# Patient Record
Sex: Female | Born: 1975 | Race: White | Hispanic: No | Marital: Married | Smoking: Never smoker
Health system: Southern US, Community
[De-identification: ages and names within clinical notes are randomized; demographics above are authoritative.]

## PROBLEM LIST (undated history)

## (undated) DIAGNOSIS — I10 Essential (primary) hypertension: Secondary | ICD-10-CM

## (undated) DIAGNOSIS — F329 Major depressive disorder, single episode, unspecified: Secondary | ICD-10-CM

## (undated) DIAGNOSIS — F32A Depression, unspecified: Secondary | ICD-10-CM

## (undated) DIAGNOSIS — Z8489 Family history of other specified conditions: Secondary | ICD-10-CM

## (undated) DIAGNOSIS — R519 Headache, unspecified: Secondary | ICD-10-CM

## (undated) DIAGNOSIS — R7303 Prediabetes: Secondary | ICD-10-CM

## (undated) DIAGNOSIS — T7840XA Allergy, unspecified, initial encounter: Secondary | ICD-10-CM

## (undated) DIAGNOSIS — F419 Anxiety disorder, unspecified: Secondary | ICD-10-CM

## (undated) DIAGNOSIS — E7800 Pure hypercholesterolemia, unspecified: Secondary | ICD-10-CM

## (undated) DIAGNOSIS — E039 Hypothyroidism, unspecified: Secondary | ICD-10-CM

## (undated) DIAGNOSIS — Z87442 Personal history of urinary calculi: Secondary | ICD-10-CM

## (undated) DIAGNOSIS — R51 Headache: Secondary | ICD-10-CM

## (undated) DIAGNOSIS — N3941 Urge incontinence: Secondary | ICD-10-CM

## (undated) DIAGNOSIS — Z8614 Personal history of Methicillin resistant Staphylococcus aureus infection: Secondary | ICD-10-CM

## (undated) HISTORY — PX: CHOLECYSTECTOMY: SHX55

## (undated) HISTORY — DX: Major depressive disorder, single episode, unspecified: F32.9

## (undated) HISTORY — DX: Anxiety disorder, unspecified: F41.9

## (undated) HISTORY — DX: Depression, unspecified: F32.A

## (undated) HISTORY — DX: Essential (primary) hypertension: I10

## (undated) HISTORY — PX: OTHER SURGICAL HISTORY: SHX169

## (undated) HISTORY — DX: Allergy, unspecified, initial encounter: T78.40XA

---

## 2002-01-03 ENCOUNTER — Other Ambulatory Visit: Admission: RE | Admit: 2002-01-03 | Discharge: 2002-01-03 | Payer: Self-pay | Admitting: Internal Medicine

## 2004-08-30 ENCOUNTER — Ambulatory Visit: Payer: Self-pay | Admitting: Family Medicine

## 2004-09-01 ENCOUNTER — Ambulatory Visit: Payer: Self-pay | Admitting: Family Medicine

## 2005-04-04 ENCOUNTER — Ambulatory Visit: Payer: Self-pay | Admitting: Family Medicine

## 2005-09-29 ENCOUNTER — Ambulatory Visit: Payer: Self-pay | Admitting: Family Medicine

## 2005-10-05 ENCOUNTER — Ambulatory Visit: Payer: Self-pay | Admitting: Family Medicine

## 2005-11-29 ENCOUNTER — Ambulatory Visit: Payer: Self-pay | Admitting: Family Medicine

## 2005-12-15 ENCOUNTER — Ambulatory Visit: Payer: Self-pay | Admitting: Family Medicine

## 2006-09-13 ENCOUNTER — Ambulatory Visit: Payer: Self-pay | Admitting: Family Medicine

## 2007-07-10 ENCOUNTER — Ambulatory Visit: Payer: Self-pay | Admitting: Internal Medicine

## 2007-07-15 ENCOUNTER — Telehealth: Payer: Self-pay | Admitting: Family Medicine

## 2007-10-11 ENCOUNTER — Ambulatory Visit: Payer: Self-pay | Admitting: Family Medicine

## 2007-10-11 ENCOUNTER — Telehealth: Payer: Self-pay | Admitting: Family Medicine

## 2007-10-12 ENCOUNTER — Encounter: Payer: Self-pay | Admitting: Family Medicine

## 2007-10-22 ENCOUNTER — Ambulatory Visit: Payer: Self-pay | Admitting: Family Medicine

## 2007-10-22 DIAGNOSIS — Z87442 Personal history of urinary calculi: Secondary | ICD-10-CM

## 2007-10-22 DIAGNOSIS — F329 Major depressive disorder, single episode, unspecified: Secondary | ICD-10-CM | POA: Insufficient documentation

## 2007-10-22 LAB — CONVERTED CEMR LAB: Vitamin B-12: 146 pg/mL — ABNORMAL LOW (ref 211–911)

## 2007-10-25 ENCOUNTER — Encounter: Payer: Self-pay | Admitting: Family Medicine

## 2007-10-29 LAB — CONVERTED CEMR LAB
Basophils Absolute: 0 10*3/uL (ref 0.0–0.1)
Eosinophils Absolute: 0.1 10*3/uL (ref 0.0–0.7)
Eosinophils Relative: 1 % (ref 0–5)
HCT: 40.8 % (ref 36.0–46.0)
MCV: 86.6 fL (ref 78.0–100.0)
Platelets: 283 10*3/uL (ref 150–400)
RDW: 11.9 % (ref 11.5–15.5)

## 2007-10-31 DIAGNOSIS — Z8614 Personal history of Methicillin resistant Staphylococcus aureus infection: Secondary | ICD-10-CM

## 2007-10-31 HISTORY — DX: Personal history of Methicillin resistant Staphylococcus aureus infection: Z86.14

## 2008-03-03 ENCOUNTER — Emergency Department (HOSPITAL_COMMUNITY): Admission: EM | Admit: 2008-03-03 | Discharge: 2008-03-03 | Payer: Self-pay | Admitting: Emergency Medicine

## 2008-03-06 ENCOUNTER — Encounter: Payer: Self-pay | Admitting: Gastroenterology

## 2008-03-06 ENCOUNTER — Ambulatory Visit: Payer: Self-pay | Admitting: Gastroenterology

## 2008-03-06 ENCOUNTER — Encounter: Admission: RE | Admit: 2008-03-06 | Discharge: 2008-03-06 | Payer: Self-pay | Admitting: Gastroenterology

## 2008-03-06 ENCOUNTER — Telehealth (INDEPENDENT_AMBULATORY_CARE_PROVIDER_SITE_OTHER): Payer: Self-pay | Admitting: *Deleted

## 2008-03-06 DIAGNOSIS — K219 Gastro-esophageal reflux disease without esophagitis: Secondary | ICD-10-CM

## 2008-03-06 DIAGNOSIS — I10 Essential (primary) hypertension: Secondary | ICD-10-CM | POA: Insufficient documentation

## 2008-03-06 DIAGNOSIS — Z9189 Other specified personal risk factors, not elsewhere classified: Secondary | ICD-10-CM | POA: Insufficient documentation

## 2008-03-06 LAB — CONVERTED CEMR LAB
Albumin: 3.7 g/dL (ref 3.5–5.2)
Alkaline Phosphatase: 89 units/L (ref 39–117)
BUN: 10 mg/dL (ref 6–23)
Calcium: 9.2 mg/dL (ref 8.4–10.5)
Eosinophils Absolute: 0.1 10*3/uL (ref 0.0–0.7)
Eosinophils Relative: 1.3 % (ref 0.0–5.0)
GFR calc Af Amer: 106 mL/min
GFR calc non Af Amer: 87 mL/min
HCT: 39.2 % (ref 36.0–46.0)
MCV: 86.9 fL (ref 78.0–100.0)
Monocytes Absolute: 0.6 10*3/uL (ref 0.1–1.0)
Neutro Abs: 5.1 10*3/uL (ref 1.4–7.7)
Platelets: 285 10*3/uL (ref 150–400)
Potassium: 4 meq/L (ref 3.5–5.1)
RDW: 11.4 % — ABNORMAL LOW (ref 11.5–14.6)
WBC: 7.6 10*3/uL (ref 4.5–10.5)

## 2008-03-27 ENCOUNTER — Ambulatory Visit (HOSPITAL_COMMUNITY): Admission: EM | Admit: 2008-03-27 | Discharge: 2008-03-29 | Payer: Self-pay | Admitting: Emergency Medicine

## 2008-03-27 ENCOUNTER — Telehealth: Payer: Self-pay | Admitting: Gastroenterology

## 2008-03-28 ENCOUNTER — Encounter (INDEPENDENT_AMBULATORY_CARE_PROVIDER_SITE_OTHER): Payer: Self-pay | Admitting: Surgery

## 2008-07-03 ENCOUNTER — Ambulatory Visit: Payer: Self-pay | Admitting: Family Medicine

## 2008-09-01 ENCOUNTER — Ambulatory Visit: Payer: Self-pay | Admitting: Family Medicine

## 2008-09-22 ENCOUNTER — Ambulatory Visit: Payer: Self-pay | Admitting: Family Medicine

## 2008-11-03 ENCOUNTER — Encounter: Admission: RE | Admit: 2008-11-03 | Discharge: 2008-11-03 | Payer: Self-pay | Admitting: Obstetrics and Gynecology

## 2009-01-20 ENCOUNTER — Inpatient Hospital Stay (HOSPITAL_COMMUNITY): Admission: AD | Admit: 2009-01-20 | Discharge: 2009-01-23 | Payer: Self-pay | Admitting: Obstetrics and Gynecology

## 2009-01-24 ENCOUNTER — Encounter: Admission: RE | Admit: 2009-01-24 | Discharge: 2009-02-15 | Payer: Self-pay | Admitting: Obstetrics and Gynecology

## 2009-04-15 ENCOUNTER — Ambulatory Visit: Payer: Self-pay | Admitting: Family Medicine

## 2009-04-16 ENCOUNTER — Encounter: Payer: Self-pay | Admitting: Family Medicine

## 2011-02-09 LAB — CBC
HCT: 35.9 % — ABNORMAL LOW (ref 36.0–46.0)
Hemoglobin: 12 g/dL (ref 12.0–15.0)
MCHC: 33.1 g/dL (ref 30.0–36.0)
MCHC: 33.5 g/dL (ref 30.0–36.0)
MCV: 85.8 fL (ref 78.0–100.0)
MCV: 87 fL (ref 78.0–100.0)
Platelets: 243 10*3/uL (ref 150–400)
RBC: 3.7 MIL/uL — ABNORMAL LOW (ref 3.87–5.11)
RBC: 4.2 MIL/uL (ref 3.87–5.11)
RDW: 13.4 % (ref 11.5–15.5)
WBC: 16.7 10*3/uL — ABNORMAL HIGH (ref 4.0–10.5)
WBC: 16.8 10*3/uL — ABNORMAL HIGH (ref 4.0–10.5)

## 2011-02-09 LAB — COMPREHENSIVE METABOLIC PANEL
ALT: 10 U/L (ref 0–35)
AST: 15 U/L (ref 0–37)
Alkaline Phosphatase: 150 U/L — ABNORMAL HIGH (ref 39–117)
CO2: 21 mEq/L (ref 19–32)
Chloride: 103 mEq/L (ref 96–112)
GFR calc Af Amer: >60 mL/min (ref >60)
GFR calc non Af Amer: >60 mL/min (ref >60)
Glucose, Bld: 106 mg/dL — ABNORMAL HIGH (ref 70–99)
Potassium: 3.9 mEq/L (ref 3.5–5.1)
Sodium: 133 mEq/L — ABNORMAL LOW (ref 135–145)

## 2011-02-09 LAB — LACTATE DEHYDROGENASE: LDH: 145 U/L (ref 94–250)

## 2011-03-14 NOTE — Op Note (Signed)
[name], [name]               ACCOUNT NO.:  [id number]   MEDICAL RECORD NO.:  [id number]          PATIENT TYPE:  INP   LOCATION:  5121                         FACILITY:  [facility]   PHYSICIAN:  Maisie Fus A. Cornett, M.D.DATE OF BIRTH:  1975/12/10   DATE OF PROCEDURE:  03/28/2008  DATE OF DISCHARGE:                               OPERATIVE REPORT   PREOPERATIVE DIAGNOSIS:  Symptomatic cholelithiasis.   POSTOPERATIVE DIAGNOSIS:  Early acute cholecystitis.   PROCEDURE:  Laparoscopic cholecystectomy with cholangiogram.   SURGEON:  Thomas A. Cornett, M.D.   ASSISTANT:  Velora Heckler, M.D.   ANESTHESIA:  General endotracheal anesthesia with 0.25% Sensorcaine  local.   ESTIMATED BLOOD LOSS:  20 mL   SPECIMEN:  Gallbladder with gallstones to pathology.   DRAINS:  None.   INDICATIONS FOR PROCEDURE:  The patient is a 35 year old female who was  initially seen by Dr. Bertram Savin for symptomatic cholelithiasis.  She  had an exacerbation of her symptoms last night, came to the emergency  room, and was admitted.  Dr. Freida Busman was unavailable to do the procedure,  and therefore, we discussed doing it here today; since her pain had  worsened.  She agreed to proceed since her pain had become  uncontrollable medically.  She presents today for laparoscopic  cholecystectomy for symptomatic cholelithiasis.   Informed consent was obtained.  Risk of bleeding, infection, duct  injury, and injury to other organs were discussed.  Also, alternative  treatments were discussed which would be nonoperative, but have a low  success rate.   DESCRIPTION OF PROCEDURE:  The patient was brought to the operating  room.  She was placed supine on the operating room table.  After  induction of general anesthesia, abdomen was prepped and draped in a  sterile fashion.  A 1-cm supraumbilical incision was made.  Dissection  was carried down to her fascia.  Her fascia was opened with a scalpel  blade.  Purse-string  suture of 0 Vicryl was placed and I used my finger  to push through the peritoneal lining into the abdominal cavity without  difficulty.  I then placed a 12-mm Hasson cannula without difficulty and  the purse-string suture was secured to the cannula.  Pneumoperitoneum  was created with 15 mmHg of CO2 and the laparoscope was placed.  She was  placed in reverse Trendelenburg and rolled to her left.  Laparoscopy  revealed no other significant abnormality.  The gallbladder was white  and edematous consistent with acute cholecystitis.  An 11-mm subxiphoid  port was placed under direct vision.  Two 5-mm ports were placed in the  right midabdomen under direct vision.  Gallbladder was identified and  grabbed by its dome.  There were adhesions of the duodenum to  gallbladder.  We gently pulled this away without injuring the duodenum  or gallbladder.  The infundibulum was then grabbed and pulled towards  the patient's right lower quadrant.  The cautery was used to score the  peritoneal covering of the gallbladder exposing the infundibulum and  cystic duct.  I was able to dissect around the cystic  duct.  This was  the only tubular structure entering the gallbladder.  I placed a single  clip on the gallbladder side of the cystic duct.  A small incision was  made in the cystic duct and bile returned.  Through a separate stab  wound, a Cook cholangiogram catheter was introduced and placed in the  cystic duct.  Clip was used to hold in place.  Intraoperative  cholangiogram was then performed using one-half strength Hypaque dye.  There was free flow of contrast down the cystic duct into the common  bile duct into the duodenum.  There was free flow up the common hepatic  duct into right and left ductules.  No evidence of stone or  extravasation.  At this point, the catheter was removed, the cystic duct  stump was triple-clipped and divided.  Cystic artery was identified.  There was both a posterior and  anterior branch.  These were controlled  individually on the gallbladder with clips.  Cautery was used to dissect  the gallbladder from the gallbladder fossa without difficulty.  Gallbladder was placed in Endocatch bag and passed off the field.  The  hilum was examined.  I saw no evidence of bleeding or leakage of bile.  Gallbladder bed was dry.  We then put the camera in the subxiphoid port,  extracted the gallbladder through the umbilicus, and passed it off the  field.  We closed this with the purse-string suture of 0 Vicryl and an  additional 0 Vicryl was used.  We suctioned out the irrigation.  All  ports were then removed after allowing CO2 to escape.  There was no  evidence of any port site bleeding under direct examination.  Once the  trocars were passed off the field, we closed the skin incision with 4-0  Monocryl.  Dermabond was applied.  All final counts of sponge, needle,  and instruments were found to be correct at this portion of the case.  The patient was then awoke and taken to recovery in satisfactory  condition.      Thomas A. Cornett, M.D.  Electronically Signed     TAC/MEDQ  D:  03/28/2008  T:  03/28/2008  Job:  [id number]

## 2011-03-14 NOTE — H&P (Signed)
NAMEDANEEN, VOLCY               ACCOUNT NO.:  [id number]   MEDICAL RECORD NO.:  [id number]          PATIENT TYPE:  INP   LOCATION:  5121                         FACILITY:  [facility]   PHYSICIAN:  Ollen Gross. Vernell Morgans, M.D. DATE OF BIRTH:  13-Dec-1975   DATE OF ADMISSION:  03/27/2008  DATE OF DISCHARGE:                              HISTORY & PHYSICAL   HISTORY OF PRESENT ILLNESS:  Ms. Tercero is 35 year old white female who  presents to the emergency department at night with 2-week history of  epigastric pain that radiates into her back.  The pain has been  associated with some nausea.  The patient has had known gallstones and  recently saw Dr. Freida Busman for this who has her setup for a cholecystectomy  on April 14, 2008.  She is concerned about her pain increasing, does not  feel she is going to make it to that day.  She otherwise denies any  fevers or chills.  No shortness of breath.  No diarrhea or dysuria.  Otherwise, review of systems is unremarkable.   PAST MEDICAL HISTORY:  Significant for reflux.   PAST SURGICAL HISTORY:  None.   MEDICATIONS:  Protonix and Percocet.   ALLERGIES:  No known drug allergies.   SOCIAL HISTORY:  She denies tobacco or tobacco products.   FAMILY HISTORY:  Noncontributory.   PHYSICAL EXAMINATION:  VITAL SIGNS:  Her temperature is 97.2, blood  pressure 150/87, and pulse 107.  GENERAL:  She is a well-developed, well-nourished white female, in no  acute distress.  SKIN:  Warm and dry.  No jaundice.  HEENT:  Eyes are anicteric.  Extraocular movements are intact.  Pupils  are equal, round, and reactive to light.  Sclerae nonicteric.  LUNGS:  Clear bilaterally with no use of accessory inspiratory muscles.  HEART:  Regular rate and rhythm with impulse in the left chest.  ABDOMEN:  Soft with moderate epigastric tenderness.  No palpable mass or  hepatosplenomegaly.  EXTREMITIES:  No cyanosis, clubbing, or edema.  Good strength in arms  and legs.  PSYCHOLOGIC:   She is alert and oriented x3 with no evidence of anxiety  or depression.   On review of her lab work, she had normal liver functions, normal  lipase, and normal white count.  Ultrasound showed stones in the  gallbladder.   ASSESSMENT AND PLAN:  This is a 35 year old white female with what  sounds like symptomatic gallstones.  Because of the risk of further  painful episodes of pancreatitis, I do think she would probably benefit  from having her  gallbladder removed.  We may have to do it during this admission, given  the amount of increasing pain she has been having.  We will plan to  admit her a night for pain control and plan for cholecystectomy during  the weekend as the schedule will allow.      Ollen Gross. Vernell Morgans, M.D.  Electronically Signed     PST/MEDQ  D:  03/27/2008  T:  03/28/2008  Job:  [id number]

## 2011-03-14 NOTE — H&P (Signed)
[name], [name]               ACCOUNT NO.:  [id number]   MEDICAL RECORD NO.:  [id number]        PATIENT TYPE:  WINP   LOCATION:                                FACILITY:  [facility]   PHYSICIAN:  Lenoard Aden, M.D.DATE OF BIRTH:  04-16-1976   DATE OF ADMISSION:  01/20/2009  DATE OF DISCHARGE:  01/23/2009                              HISTORY & PHYSICAL   CHIEF COMPLAINT:  Gestational hypertension at 39 weeks, for induction.   HISTORY:  The patient is a 35 year old white female G2, P2 at 74 weeks'  gestation for cervical ripening and induction.  The patient has a  history of stable gestational hypertension and __________ headaches.  The patient's blood pressure in the office is ranging __________.  Blood  pressure has been __________ previously normal __________ 24-hour urine.   ALLERGIES:  She has no known drug allergies.   MEDICATIONS:  Prenatal vitamins.   FAMILY HISTORY:  Family history of heart disease and diabetes.   PREVIOUS OB HISTORY:  Remarkable for 1 vaginal delivery of a 9 pound 12  ounce fetus and a previous history of occult __________.   PRENATAL COURSE:  Complicated by hypertension.   PHYSICAL EXAMINATION:  GENERAL:  She is a well-developed, well-nourished  white female in no acute distress.  VITAL SIGNS:  Blood pressure is 138 to 140s over 90.  HEENT:  Normal.  LUNGS:  Clear.  HEART:  Regular rate and rhythm.  ABDOMEN:  Soft, gravid, and nontender.  Estimated fetal weight 8 pounds.  Cervix is closed, 50% vertex, -1.  EXTREMITIES:  There are no cords.  NEUROLOGIC:  Nonfocal.  SKIN:  Intact.  Cervidil was placed __________ reactive.   IMPRESSION:  A 39-week intrauterine pregnancy with normal interval  growth.  Gestational hypertension with labile hypertension __________.  Most recent ultrasound consistent with reassuring fetal heart rate  surveillance, but notable for AFI that remains stable but decreased,  borderline __________.   PLAN:  We will  proceed with cervical ripening, induction __________.  We  will check labs.      Lenoard Aden, M.D.  Electronically Signed     RJT/MEDQ  D:  01/20/2009  T:  01/21/2009  Job:  [id number]

## 2011-03-17 NOTE — Assessment & Plan Note (Signed)
[facility]                                 ON-CALL NOTE   NAME:STALEYFrancys, Bolin                      MRN:          [id number]  DATE:07/15/2007                            DOB:          [birth year]    Phone call came from her husband.  I don't have the phone number now  because it is just on my phone, not on my pager.  Date of birth is  [birth year].  She is a patient of Dr. Roxy Manns.  She calls  because she was congested and was seen last Wednesday by Dr. Ermalene Searing,  given a Z-Pak.  Dr. Ermalene Searing was concerned about early pneumonia.  She  seemed to improve a little bit but has gotten a little worse since that  time.  Not really too sick, with shortness of breath or anything acute,  but her husband was hoping to get something else for her to nip it in  the bud.   PLAN:  I told him we didn't phone in antibiotics after hours.  Didn't  sound like she needed emergency room care at this time so I asked him to  call again tomorrow morning after 8:30.  He says he did call several  times today but hadn't gotten a call back.     Karie Schwalbe, MD  Electronically Signed    RIL/MedQ  DD: 07/15/2007  DT: 07/16/2007  Job #: [id number]   cc:   Kerby Nora, MD  Audrie Gallus Milinda Antis, MD

## 2011-07-26 LAB — CBC
HCT: 38.8
Hemoglobin: 13.5
MCHC: 34.8
MCV: 86
Platelets: 287
RBC: 4.51
RDW: 12.2
WBC: 8.2

## 2011-07-26 LAB — DIFFERENTIAL
Basophils Absolute: 0
Basophils Relative: 0
Eosinophils Absolute: 0.2
Eosinophils Relative: 2
Lymphocytes Relative: 27
Lymphs Abs: 2.2
Monocytes Absolute: 0.6
Monocytes Relative: 7
Neutro Abs: 5.1
Neutrophils Relative %: 63

## 2011-07-26 LAB — COMPREHENSIVE METABOLIC PANEL
AST: 16
Albumin: 4.1
Alkaline Phosphatase: 80
CO2: 27
Chloride: 106
Creatinine, Ser: 0.88
GFR calc Af Amer: >60
GFR calc non Af Amer: >60
Potassium: 3.9
Total Bilirubin: 0.5

## 2011-07-26 LAB — COMPREHENSIVE METABOLIC PANEL WITH GFR
ALT: 14
BUN: 6
Calcium: 9.5
Glucose, Bld: 109 — ABNORMAL HIGH
Sodium: 139
Total Protein: 7.2

## 2011-07-26 LAB — LIPASE, BLOOD: Lipase: 21

## 2011-12-18 ENCOUNTER — Ambulatory Visit: Payer: Self-pay | Admitting: Family Medicine

## 2012-01-18 ENCOUNTER — Ambulatory Visit: Payer: Self-pay | Admitting: Family Medicine

## 2013-05-21 ENCOUNTER — Ambulatory Visit: Payer: BC Managed Care – PPO

## 2013-05-21 ENCOUNTER — Ambulatory Visit (INDEPENDENT_AMBULATORY_CARE_PROVIDER_SITE_OTHER): Payer: BC Managed Care – PPO | Admitting: Family Medicine

## 2013-05-21 VITALS — BP 164/104 | HR 110 | Temp 98.0°F | Resp 18 | Ht 67.5 in | Wt 258.0 lb

## 2013-05-21 DIAGNOSIS — R079 Chest pain, unspecified: Secondary | ICD-10-CM

## 2013-05-21 DIAGNOSIS — R609 Edema, unspecified: Secondary | ICD-10-CM

## 2013-05-21 DIAGNOSIS — I471 Supraventricular tachycardia: Secondary | ICD-10-CM

## 2013-05-21 DIAGNOSIS — I10 Essential (primary) hypertension: Secondary | ICD-10-CM

## 2013-05-21 DIAGNOSIS — R600 Localized edema: Secondary | ICD-10-CM

## 2013-05-21 LAB — POCT CBC
Granulocyte percent: 70.8 %G (ref 37–80)
HCT, POC: 42.4 % (ref 37.7–47.9)
Hemoglobin: 13.3 g/dL (ref 12.2–16.2)
MCHC: 31.4 g/dL — AB (ref 31.8–35.4)
MPV: 8.4 fL (ref 0–99.8)
POC Granulocyte: 9 — AB (ref 2–6.9)
POC MID %: 5.7 %M (ref 0–12)
RBC: 4.68 M/uL (ref 4.04–5.48)

## 2013-05-21 LAB — POCT URINALYSIS DIPSTICK
Glucose, UA: NEGATIVE
Leukocytes, UA: NEGATIVE
Nitrite, UA: NEGATIVE
Urobilinogen, UA: 0.2

## 2013-05-21 LAB — POCT URINE PREGNANCY: Preg Test, Ur: NEGATIVE

## 2013-05-21 MED ORDER — METOPROLOL SUCCINATE ER 50 MG PO TB24: ORAL_TABLET | ORAL | Status: DC

## 2013-05-21 MED ORDER — HYDROCHLOROTHIAZIDE 12.5 MG PO TABS: 12.5000 mg | ORAL_TABLET | Freq: Every day | ORAL | Status: DC

## 2013-05-21 NOTE — Progress Notes (Signed)
[address]   [place], [place]  [address]   [phone]  Subjective:    Patient ID: [name], female    DOB: 10-21-1976, 37 y.o.   MRN: [id number]  HPI This 37 y.o. female presents for evaluation of tachycardia, chest pain, leg swelling.  Onset of rapid heart rate; lost forty pounds in past year.  Has gained seven pounds in past week.  Started walking with friend; almost passed out; heart 136-140; got a little nervious; stipped walking.  Sold house; lots of stressors.  Grandfather in hospital.  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  Now must pack.  Husband thinks that a lot is stress.  Burning up; feels heart fluttering.  Upon awakening, less than 100 every morning.  Elevates to 114 during the day.  Stays in 90s at lowest during the day.  Bad headache; had a severe headache all night long; no nausea, blurred vision, dizziness, photosensitivies.  Headaches few times per week; taking Advil with relief.  Yesterday was stressful.  Considering building.  Since school let out, jpt was sick; went to walk in clinic with URI.  Then got pink eye; got sick again; slept all week.  Went to walk in clinic; diagnosed with bronchitis; now losing the cough.  Leg swelling; legs hurt from swelling.  Pain in legs at night.  No medications currenlty; stopped all medications since 05/2012.  Emotionally good until stressors.  LMP regular; 05/01/13.  Hands swollen. Can wear rings in morning but hurts in evening.  Had severe chest pain two days ago while driving home from sister's house; left sided chest pain; no associated nausea, SOB, diaphoresis. Thought likely secondary to GERD; had eaten BBQ the night before; no frequent indigestion or heartburn.   Review of Systems  Constitutional: Negative for fever, chills, diaphoresis, fatigue and unexpected weight change.  HENT: Negative for ear pain, congestion, rhinorrhea, sneezing and postnasal drip.   Respiratory: Negative for cough, shortness of  breath, wheezing and stridor.   Cardiovascular: Positive for chest pain, palpitations and leg swelling.  Gastrointestinal: Negative for nausea and vomiting.  Neurological: Positive for dizziness, light-headedness and headaches. Negative for tremors, seizures, syncope, facial asymmetry, speech difficulty, weakness and numbness.    Past Medical History  Diagnosis Date  . Allergy   . Depression   . Chronic kidney disease   . Anxiety     Past Surgical History  Procedure Laterality Date  . Cholecystectomy      Prior to Admission medications   Not on File    No Known Allergies  History   Social History  . Marital Status: Married    Spouse Name: N/A    Number of Children: 2  . Years of Education: N/A   Occupational History  . 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 Smoking status: Never Smoker   . Smokeless tobacco: Not on file  . Alcohol Use: No  . Drug Use: No  . Sexually Active: Yes   Other Topics Concern  . Not on file   Social History Narrative   Marital status: married      Children: two (son 33 yo and daughter 20 yo)      Lives: with husband, two children.      Employment: Runner, broadcasting/film/video at [employer]      Tobacco: none      Alcohol: none  Drugs: none      Exercise: walking    Family History  Problem Relation Age of Onset  . Diabetes Mother   . Heart disease Mother     CAD with stenting  . Hypertension Mother   . Hyperlipidemia Mother   . Hypothyroidism Mother   . Heart disease Father   . Heart disease Maternal Grandmother   . Stroke Maternal Grandmother   . Heart disease Paternal Grandmother   . Heart disease Paternal Grandfather        Objective:   Physical Exam  Nursing note and vitals reviewed. Constitutional: She is oriented to person, place, and time. She appears well-developed and well-nourished. No distress.  HENT:  Head: Normocephalic and atraumatic.  Right Ear:  External ear normal.  Left Ear: External ear normal.  Nose: Nose normal.  Mouth/Throat: Oropharynx is clear and moist.  Eyes: Conjunctivae and EOM are normal. Pupils are equal, round, and reactive to light.  Neck: Normal range of motion. Neck supple. No JVD present. No thyromegaly present.  Cardiovascular: Normal heart sounds.  Tachycardia present.  Exam reveals no gallop and no friction rub.   No murmur heard. B non-pitting edema BLE up to upper calves B; Hommen's negative.  Pulmonary/Chest: Effort normal and breath sounds normal. She has no wheezes. She has no rales.  Abdominal: Soft. Bowel sounds are normal. She exhibits no distension. There is no tenderness. There is no rebound and no guarding.  Lymphadenopathy:    She has no cervical adenopathy.  Neurological: She is alert and oriented to person, place, and time. No cranial nerve deficit. She exhibits normal muscle tone. Coordination normal.  Skin: Skin is warm and dry. No rash noted. She is not diaphoretic.  Psychiatric: She has a normal mood and affect. Her behavior is normal. Judgment and thought content normal.    EKG: sinus tachycardia at 104; no ST changes.  Results for orders placed in visit on 05/21/13  POCT CBC      Result Value Range   WBC 12.7 (*) 4.6 - 10.2 K/uL   Lymph, poc 3.0  0.6 - 3.4   POC LYMPH PERCENT 23.5  10 - 50 %L   MID (cbc) 0.7  0 - 0.9   POC MID % 5.7  0 - 12 %M   POC Granulocyte 9.0 (*) 2 - 6.9   Granulocyte percent 70.8  37 - 80 %G   RBC 4.68  4.04 - 5.48 M/uL   Hemoglobin 13.3  12.2 - 16.2 g/dL   HCT, POC 40.9  81.1 - 47.9 %   MCV 90.5  80 - 97 fL   MCH, POC 28.4  27 - 31.2 pg   MCHC 31.4 (*) 31.8 - 35.4 g/dL   RDW, POC 91.4     Platelet Count, POC 294  142 - 424 K/uL   MPV 8.4  0 - 99.8 fL  POCT URINE PREGNANCY      Result Value Range   Preg Test, Ur Negative    POCT URINALYSIS DIPSTICK      Result Value Range   Color, UA yellow     Clarity, UA cloudy     Glucose, UA neg      Bilirubin, UA neg     Ketones, UA neg     Spec Grav, UA 1.025     Blood, UA trace-intact     pH, UA 6.0     Protein, UA neg     Urobilinogen, UA 0.2  Nitrite, UA neg     Leukocytes, UA Negative     [facility] reading (PRIMARY) by  Dr. Katrinka Blazing. CXR:  NAD.      Assessment & Plan:  Chest pain - Plan: POCT CBC, POCT urine pregnancy, POCT urinalysis dipstick, TSH, Comprehensive metabolic panel, DG Chest 2 View  Edema of both legs - Plan: POCT CBC, POCT urine pregnancy, POCT urinalysis dipstick, TSH, Comprehensive metabolic panel, DG Chest 2 View  Paroxysmal supraventricular tachycardia - Plan: POCT CBC, POCT urine pregnancy, POCT urinalysis dipstick, TSH, Comprehensive metabolic panel, DG Chest 2 View  Essential hypertension, benign - Plan: hydrochlorothiazide (HYDRODIURIL) 12.5 MG tablet, metoprolol succinate (TOPROL-XL) 50 MG 24 hr tablet   1.  Chest pain;  New. Atypical; non-exertional.  EKG normal other than tachycardia at 104.  If persists, will warrant referral to cardiology for stress testing.  To ED for recurrence; patient agreeable. Patient declined referral to cardiology at this time; would like to try medication first. 2.  Tachycardia:  New.  Ranging from 100-135; EKG with sinus tach at 104.  Obtain labs; rx for Metoprolol ER 50mg  1/2 daily.  Pt declined referral to cardiology at this time; prefers medication trial. 3.  HTN:  New onset.  Multiple walk in clinic visits with elevated pulse and BP.  Rx for HCTZ 12.5mg  once daily; Rx for Metoprolol ER 50mg  1/2 qhs.  Obtain labs.  F/u 3 weeks for reevaluation. 4.  Edema BLE:  New onset.  Obtain labs. Rx for HCTZ provided.  Meds ordered this encounter  Medications  . hydrochlorothiazide (HYDRODIURIL) 12.5 MG tablet    Sig: Take 1 tablet (12.5 mg total) by mouth daily.    Dispense:  30 tablet    Refill:  5  . metoprolol succinate (TOPROL-XL) 50 MG 24 hr tablet    Sig: 1/2 tablet daily.  Take with or immediately following a meal.     Dispense:  30 tablet    Refill:  5

## 2013-05-22 ENCOUNTER — Telehealth: Payer: Self-pay

## 2013-05-22 NOTE — Progress Notes (Signed)
Appt made with pt for 8/13 at 4pm

## 2013-05-22 NOTE — Telephone Encounter (Signed)
Pt is having headaches and is not sure if it is the medication she is taking for blood pressure of something else   Please call [phone]

## 2013-05-23 ENCOUNTER — Encounter: Payer: Self-pay | Admitting: Family Medicine

## 2013-05-23 LAB — COMPREHENSIVE METABOLIC PANEL
AST: 17 U/L (ref 0–37)
Alkaline Phosphatase: 95 U/L (ref 39–117)
BUN: 8 mg/dL (ref 6–23)
Glucose, Bld: 88 mg/dL (ref 70–99)
Sodium: 137 mEq/L (ref 135–145)
Total Bilirubin: 0.4 mg/dL (ref 0.3–1.2)
Total Protein: 7 g/dL (ref 6.0–8.3)

## 2013-05-23 MED ORDER — SPIRONOLACTONE 25 MG PO TABS: 25.0000 mg | ORAL_TABLET | Freq: Every day | ORAL | Status: DC

## 2013-05-23 NOTE — Telephone Encounter (Signed)
Called her to advise.  

## 2013-05-23 NOTE — Telephone Encounter (Signed)
I have sent in Spironolactone to pharmacy which is a different diuretic.

## 2013-05-23 NOTE — Telephone Encounter (Signed)
Patient states she has headache and feels bad on the HCTZ she has d/c and she feels better today. She wants an alternative to this medication. Please advise. She will go to [facility] and check her BP and let me know what it is.

## 2013-05-28 ENCOUNTER — Encounter: Payer: Self-pay | Admitting: Family Medicine

## 2013-06-11 ENCOUNTER — Encounter: Payer: Self-pay | Admitting: Family Medicine

## 2013-06-11 ENCOUNTER — Ambulatory Visit (INDEPENDENT_AMBULATORY_CARE_PROVIDER_SITE_OTHER): Payer: BC Managed Care – PPO | Admitting: Family Medicine

## 2013-06-11 VITALS — BP 136/97 | HR 109 | Temp 98.3°F | Resp 18 | Ht 66.75 in | Wt 259.0 lb

## 2013-06-11 DIAGNOSIS — J309 Allergic rhinitis, unspecified: Secondary | ICD-10-CM

## 2013-06-11 DIAGNOSIS — I10 Essential (primary) hypertension: Secondary | ICD-10-CM

## 2013-06-11 DIAGNOSIS — R000 Tachycardia, unspecified: Secondary | ICD-10-CM

## 2013-06-11 DIAGNOSIS — M545 Low back pain: Secondary | ICD-10-CM

## 2013-06-11 DIAGNOSIS — F411 Generalized anxiety disorder: Secondary | ICD-10-CM

## 2013-06-11 LAB — BASIC METABOLIC PANEL
Calcium: 9.5 mg/dL (ref 8.4–10.5)
Glucose, Bld: 104 mg/dL — ABNORMAL HIGH (ref 70–99)
Potassium: 4.1 mEq/L (ref 3.5–5.3)
Sodium: 136 mEq/L (ref 135–145)

## 2013-06-11 MED ORDER — METOPROLOL SUCCINATE ER 50 MG PO TB24: ORAL_TABLET | ORAL | Status: DC

## 2013-06-11 MED ORDER — SERTRALINE HCL 50 MG PO TABS: 50.0000 mg | ORAL_TABLET | Freq: Every day | ORAL | Status: DC

## 2013-06-11 MED ORDER — CYCLOBENZAPRINE HCL 5 MG PO TABS: 5.0000 mg | ORAL_TABLET | Freq: Every evening | ORAL | Status: DC | PRN

## 2013-06-11 NOTE — Progress Notes (Signed)
[address]   [place], [place]  [address]   [phone]  Subjective:    Patient ID: [name], female    DOB: 1976/06/11, 37 y.o.   MRN: [id number]  HPI This 37 y.o. female presents for three week follow-up:  1.  Tachycardia:  Pulse running 90s most of the time; less tachycardia since starting Metoprolol.    2.  Hypertension:  At home, BP running 148-150/85-90.  Taking Metoprolol ER 50mg  1/2 at 4:00pm.  Suffered with severe nausea, headache; really got dizzy while taking HCTZ.  Stopped taking it; no problems with Spironolactone.  3.  Anxiety: requesting Zoloft.  Husband says that patient is mean.  Currently about to move and stress is really getting to patient.  Irritable; short tempered.    4.  Low back pain:  Husband stood on back and caused worsening back pain.  R sided; +radiation into leg.  No n/t.  No weakness; no b/b dysfunction; no saddle paresthesias.  Taken nothing.    5.  Leg swelling:  Much better; stopped after first two weeks.  Today feels swollen but time for menses.    Review of Systems  Constitutional: Negative for fever, chills, diaphoresis and fatigue.  Respiratory: Negative for shortness of breath, wheezing and stridor.   Cardiovascular: Negative for chest pain, palpitations and leg swelling.  Gastrointestinal: Negative for nausea and vomiting.  Musculoskeletal: Positive for myalgias, back pain and gait problem. Negative for joint swelling and arthralgias.  Neurological: Negative for dizziness, tremors, seizures, syncope, facial asymmetry, speech difficulty, weakness, light-headedness, numbness and headaches.  Psychiatric/Behavioral: Negative for suicidal ideas, sleep disturbance, self-injury and dysphoric mood. The patient is nervous/anxious.        Objective:   Physical Exam  Nursing note and vitals reviewed. Constitutional: She is oriented to person, place, and time. She appears well-developed and well-nourished. No distress.  HENT:  Head:  Normocephalic and atraumatic.  Eyes: Conjunctivae and EOM are normal. Pupils are equal, round, and reactive to light.  Neck: Normal range of motion. Neck supple. No thyromegaly present.  Cardiovascular: Normal rate, regular rhythm, normal heart sounds and intact distal pulses.  Exam reveals no gallop and no friction rub.   No murmur heard. Pulmonary/Chest: Effort normal and breath sounds normal. She has no wheezes. She has no rales.  Musculoskeletal:       Lumbar back: She exhibits decreased range of motion, tenderness, pain and spasm. She exhibits no bony tenderness.  Lumbar spine:  +TTP midline and paraspinal region; straight leg raises equivocal; motor 5/5 BLE.  Toe and heel walking intact; marching intact.  Lymphadenopathy:    She has no cervical adenopathy.  Neurological: She is alert and oriented to person, place, and time. She has normal strength. No sensory deficit.  Skin: Skin is warm and dry. No rash noted. She is not diaphoretic.  Psychiatric: She has a normal mood and affect. Her behavior is normal. Judgment and thought content normal.       Assessment & Plan:  Essential hypertension, benign - Plan: metoprolol succinate (TOPROL-XL) 50 MG 24 hr tablet, Basic metabolic panel  Generalized anxiety disorder - Plan: sertraline (ZOLOFT) 50 MG tablet  Tachycardia - Plan: Basic metabolic panel  Allergic rhinitis  Low back pain   1.  HTN: improved; increase Metoprolol ER to 50mg  one tablet daily.  Continue Spironolactone.  Obtain labs. 2. Tachycardia; improving; will increase Metoprolol to 50mg  one tablet daily. 3.  LE swelling: improved with Spironolactone. 4.  Anxiety:  New. Rx for Zoloft provided. 5.  Low back pain:  New.  Recommend rest, heat, frequent ambulation; rx for Flexeril provided. 6. Allergic Rhinitis: worsening; recommend Claritin or Zyrtec or Allegra daily.    Meds ordered this encounter  Medications  . metoprolol succinate (TOPROL-XL) 50 MG 24 hr tablet     Sig: 1 tablet daily.  Take with or immediately following a meal.    Dispense:  30 tablet    Refill:  5  . sertraline (ZOLOFT) 50 MG tablet    Sig: Take 1 tablet (50 mg total) by mouth daily.    Dispense:  30 tablet    Refill:  3  . cyclobenzaprine (FLEXERIL) 5 MG tablet    Sig: Take 1 tablet (5 mg total) by mouth at bedtime as needed for muscle spasms.    Dispense:  30 tablet    Refill:  1

## 2013-06-16 ENCOUNTER — Encounter: Payer: Self-pay | Admitting: Family Medicine

## 2013-08-20 ENCOUNTER — Encounter: Payer: Self-pay | Admitting: Family Medicine

## 2013-08-20 ENCOUNTER — Ambulatory Visit (INDEPENDENT_AMBULATORY_CARE_PROVIDER_SITE_OTHER): Payer: BC Managed Care – PPO | Admitting: Family Medicine

## 2013-08-20 VITALS — BP 140/82 | HR 90 | Temp 97.0°F | Resp 18 | Ht 68.0 in | Wt 260.2 lb

## 2013-08-20 DIAGNOSIS — I10 Essential (primary) hypertension: Secondary | ICD-10-CM

## 2013-08-20 DIAGNOSIS — F419 Anxiety disorder, unspecified: Secondary | ICD-10-CM

## 2013-08-20 DIAGNOSIS — R000 Tachycardia, unspecified: Secondary | ICD-10-CM

## 2013-08-20 DIAGNOSIS — E039 Hypothyroidism, unspecified: Secondary | ICD-10-CM

## 2013-08-20 DIAGNOSIS — F411 Generalized anxiety disorder: Secondary | ICD-10-CM

## 2013-08-20 LAB — CBC WITH DIFFERENTIAL/PLATELET
Basophils Absolute: 0 10*3/uL (ref 0.0–0.1)
Eosinophils Relative: 1 % (ref 0–5)
HCT: 37.8 % (ref 36.0–46.0)
Hemoglobin: 13.1 g/dL (ref 12.0–15.0)
Lymphocytes Relative: 29 % (ref 12–46)
Lymphs Abs: 3.1 10*3/uL (ref 0.7–4.0)
MCV: 82 fL (ref 78.0–100.0)
Monocytes Absolute: 0.6 10*3/uL (ref 0.1–1.0)
Monocytes Relative: 6 % (ref 3–12)
Neutro Abs: 6.6 10*3/uL (ref 1.7–7.7)
RBC: 4.61 MIL/uL (ref 3.87–5.11)
WBC: 10.5 10*3/uL (ref 4.0–10.5)

## 2013-08-20 MED ORDER — METOPROLOL SUCCINATE ER 50 MG PO TB24: ORAL_TABLET | ORAL | Status: DC

## 2013-08-20 MED ORDER — SPIRONOLACTONE 25 MG PO TABS: 25.0000 mg | ORAL_TABLET | Freq: Every day | ORAL | Status: DC

## 2013-08-20 MED ORDER — SERTRALINE HCL 50 MG PO TABS: 50.0000 mg | ORAL_TABLET | Freq: Every day | ORAL | Status: DC

## 2013-08-20 NOTE — Progress Notes (Signed)
[address]   [place], [place]  [address]   [phone]  Subjective:    Patient ID: [name], female    DOB: [birth year], 37 y.o.   MRN: [id number]  HPI This 37 y.o. female presents for two month evaluation of the following:  1.  HTN:  Two month follow-up; at last visit increased Metoprolol to 50mg  daily.    2. Tachycardia:  Pulse is 70-80s; with walking to 90s. Metoprolol increased to 50mg  at last visit.   3.  Anxiety:  Has been living with in-laws.  Closing on Monday.  Keeping calm with Zoloft.  At last visit, Zoloft started.  4. Leg swelling:  No further leg swelling.   5.  Hypothyroidism:  OB/GYN placed on Levothyroxine daily; due for six week labs.    6.  Lower back pain, menometrorrhagia:  Started menses right after last visit; when menses completed, lower back pain resolved.  Review of Systems  Constitutional: Negative for fever, chills, diaphoresis and fatigue.  Respiratory: Negative for shortness of breath.   Cardiovascular: Negative for chest pain, palpitations and leg swelling.  Endocrine: Negative for cold intolerance, heat intolerance, polydipsia, polyphagia and polyuria.  Musculoskeletal: Negative for back pain and myalgias.  Neurological: Negative for dizziness, syncope, weakness, light-headedness and headaches.  Psychiatric/Behavioral: Negative for dysphoric mood and decreased concentration. The patient is not nervous/anxious.    Past Medical History  Diagnosis Date  . Allergy   . Depression   . Chronic kidney disease   . Anxiety    Past Surgical History  Procedure Laterality Date  . Cholecystectomy     Allergies  Allergen Reactions  . Hctz [Hydrochlorothiazide]     Feels bad on this medication/ has headache.   History   Social History  . Marital Status: Married    Spouse Name: N/A    Number of Children: 2  . Years of Education: N/A   Occupational History  . 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 Smoking status: Never Smoker   . Smokeless tobacco: Not on file  . Alcohol Use: No  . Drug Use: No  . Sexual Activity: Yes   Other Topics Concern  . Not on file   Social History Narrative   Marital status: married      Children: two (son 66 yo and daughter 59 yo)      Lives: with husband, two children.      Employment: Runner, broadcasting/film/video at [employer]      Tobacco: none      Alcohol: none      Drugs: none      Exercise: walking   Family History  Problem Relation Age of Onset  . Diabetes Mother   . Heart disease Mother     CAD with stenting  . Hypertension Mother   . Hyperlipidemia Mother   . Hypothyroidism Mother   . Heart disease Father   . Heart disease Maternal Grandmother   . Stroke Maternal Grandmother   . Heart disease Paternal Grandmother   . Heart disease Paternal Grandfather        Objective:   Physical Exam  Constitutional: She is oriented to person, place, and time. She appears well-developed and well-nourished. No distress.  HENT:  Head: Normocephalic and atraumatic.  Right Ear: External ear normal.  Left Ear: External ear normal.  Nose: Nose normal.  Mouth/Throat: Oropharynx is clear and moist.  Eyes: Conjunctivae  and EOM are normal. Pupils are equal, round, and reactive to light.  Neck: Normal range of motion. Neck supple. Carotid bruit is not present. No thyromegaly present.  Cardiovascular: Normal rate, regular rhythm, normal heart sounds and intact distal pulses.  Exam reveals no gallop and no friction rub.   No murmur heard. Pulmonary/Chest: Effort normal and breath sounds normal. She has no wheezes. She has no rales.  Abdominal: Soft. Bowel sounds are normal. She exhibits no distension and no mass. There is no tenderness. There is no rebound and no guarding.  Musculoskeletal:       Lumbar back: Normal.  Lymphadenopathy:    She has no cervical adenopathy.  Neurological: She is alert and oriented to person, place, and time.  No cranial nerve deficit.  Skin: Skin is warm and dry. No rash noted. She is not diaphoretic. No erythema. No pallor.  Psychiatric: She has a normal mood and affect. Her behavior is normal.       Assessment & Plan:  HYPERTENSION - Plan: CBC with Differential, Comprehensive metabolic panel  Tachycardia - Plan: CBC with Differential, Comprehensive metabolic panel  Unspecified hypothyroidism - Plan: TSH, T4, free  Anxiety  Essential hypertension, benign - Plan: metoprolol succinate (TOPROL-XL) 50 MG 24 hr tablet, CBC with Differential, Comprehensive metabolic panel  Generalized anxiety disorder - Plan: sertraline (ZOLOFT) 50 MG tablet  1.  HTN: improved; no change in therapy at this time; refills of Metoprolol ER 50mg  daily, Spironolactone 25mg  daily. 2.  Anxiety with depression: improved with Zoloft 50mg  daily; refill provided. 3.  Hypothyroidism: New.  Refill of levothyroxine provided. 4. Tachycardia: improved with Metoprolol.    Meds ordered this encounter  Medications  . DISCONTD: levothyroxine (SYNTHROID, LEVOTHROID) 25 MCG tablet    Sig: Take 25 mcg by mouth daily before breakfast.  . metoprolol succinate (TOPROL-XL) 50 MG 24 hr tablet    Sig: 1 tablet daily.  Take with or immediately following a meal.    Dispense:  30 tablet    Refill:  5  . sertraline (ZOLOFT) 50 MG tablet    Sig: Take 1 tablet (50 mg total) by mouth daily.    Dispense:  30 tablet    Refill:  5  . spironolactone (ALDACTONE) 25 MG tablet    Sig: Take 1 tablet (25 mg total) by mouth daily.    Dispense:  30 tablet    Refill:  5  . levothyroxine (SYNTHROID, LEVOTHROID) 50 MCG tablet    Sig: Take 1 tablet (50 mcg total) by mouth daily before breakfast.    Dispense:  30 tablet    Refill:  5   Nilda Simmer, M.D.  [facility] [address] [place], [place]  [address] [phone] phone [phone] fax

## 2013-08-21 LAB — COMPREHENSIVE METABOLIC PANEL
AST: 18 U/L (ref 0–37)
Albumin: 4.5 g/dL (ref 3.5–5.2)
BUN: 17 mg/dL (ref 6–23)
CO2: 26 mEq/L (ref 19–32)
Calcium: 9.4 mg/dL (ref 8.4–10.5)
Chloride: 102 mEq/L (ref 96–112)
Creat: 0.8 mg/dL (ref 0.50–1.10)
Potassium: 4.6 mEq/L (ref 3.5–5.3)

## 2013-08-21 LAB — TSH: TSH: 4.135 u[IU]/mL (ref 0.350–4.500)

## 2013-08-22 ENCOUNTER — Encounter: Payer: Self-pay | Admitting: Family Medicine

## 2013-08-22 MED ORDER — LEVOTHYROXINE SODIUM 50 MCG PO TABS: 50.0000 ug | ORAL_TABLET | Freq: Every day | ORAL | Status: DC

## 2013-09-04 ENCOUNTER — Other Ambulatory Visit: Payer: Self-pay

## 2014-02-02 ENCOUNTER — Encounter: Payer: Self-pay | Admitting: Family Medicine

## 2014-02-02 ENCOUNTER — Ambulatory Visit (INDEPENDENT_AMBULATORY_CARE_PROVIDER_SITE_OTHER): Payer: BC Managed Care – PPO | Admitting: Family Medicine

## 2014-02-02 VITALS — BP 130/86 | HR 72 | Temp 98.4°F | Resp 16 | Ht 66.0 in | Wt 260.0 lb

## 2014-02-02 DIAGNOSIS — E039 Hypothyroidism, unspecified: Secondary | ICD-10-CM

## 2014-02-02 DIAGNOSIS — F411 Generalized anxiety disorder: Secondary | ICD-10-CM

## 2014-02-02 DIAGNOSIS — I10 Essential (primary) hypertension: Secondary | ICD-10-CM

## 2014-02-02 LAB — COMPLETE METABOLIC PANEL WITH GFR
ALBUMIN: 4.1 g/dL (ref 3.5–5.2)
ALT: 14 U/L (ref 0–35)
AST: 14 U/L (ref 0–37)
Alkaline Phosphatase: 88 U/L (ref 39–117)
BUN: 11 mg/dL (ref 6–23)
CALCIUM: 9.3 mg/dL (ref 8.4–10.5)
CHLORIDE: 103 meq/L (ref 96–112)
CO2: 25 meq/L (ref 19–32)
Creat: 0.69 mg/dL (ref 0.50–1.10)
GFR, Est Non African American: >89 mL/min
Glucose, Bld: 116 mg/dL — ABNORMAL HIGH (ref 70–99)
POTASSIUM: 4.5 meq/L (ref 3.5–5.3)
Sodium: 137 mEq/L (ref 135–145)
Total Bilirubin: 0.3 mg/dL (ref 0.2–1.2)
Total Protein: 6.8 g/dL (ref 6.0–8.3)

## 2014-02-02 LAB — CBC WITH DIFFERENTIAL/PLATELET
BASOS PCT: 0 % (ref 0–1)
Basophils Absolute: 0 10*3/uL (ref 0.0–0.1)
Eosinophils Absolute: 0.1 10*3/uL (ref 0.0–0.7)
Eosinophils Relative: 1 % (ref 0–5)
HEMATOCRIT: 39.9 % (ref 36.0–46.0)
HEMOGLOBIN: 13.7 g/dL (ref 12.0–15.0)
LYMPHS ABS: 2.2 10*3/uL (ref 0.7–4.0)
Lymphocytes Relative: 22 % (ref 12–46)
MCH: 28.1 pg (ref 26.0–34.0)
MCHC: 34.3 g/dL (ref 30.0–36.0)
MCV: 81.8 fL (ref 78.0–100.0)
MONO ABS: 0.6 10*3/uL (ref 0.1–1.0)
MONOS PCT: 6 % (ref 3–12)
NEUTROS ABS: 7.1 10*3/uL (ref 1.7–7.7)
Neutrophils Relative %: 71 % (ref 43–77)
Platelets: 297 10*3/uL (ref 150–400)
RBC: 4.88 MIL/uL (ref 3.87–5.11)
RDW: 13.6 % (ref 11.5–15.5)
WBC: 10 10*3/uL (ref 4.0–10.5)

## 2014-02-02 LAB — TSH: TSH: 4.134 u[IU]/mL (ref 0.350–4.500)

## 2014-02-02 LAB — T4, FREE: Free T4: 0.95 ng/dL (ref 0.80–1.80)

## 2014-02-02 MED ORDER — METOPROLOL SUCCINATE ER 50 MG PO TB24: ORAL_TABLET | ORAL | Status: DC

## 2014-02-02 MED ORDER — LEVOTHYROXINE SODIUM 50 MCG PO TABS: 50.0000 ug | ORAL_TABLET | Freq: Every day | ORAL | Status: DC

## 2014-02-02 MED ORDER — SERTRALINE HCL 50 MG PO TABS: 50.0000 mg | ORAL_TABLET | Freq: Every day | ORAL | Status: DC

## 2014-02-02 MED ORDER — SPIRONOLACTONE 25 MG PO TABS: 25.0000 mg | ORAL_TABLET | Freq: Every day | ORAL | Status: DC

## 2014-02-02 NOTE — Progress Notes (Signed)
Subjective:    Patient ID: [name], female    DOB: Dec 24, 1975, 38 y.o.   MRN: [id number]  This chart was scribed for Wardell Honour, MD by Maree Erie, ED Scribe. The patient was seen in room 23. Patient's care was started at 10:51 AM.  Chief Complaint  Patient presents with  . Hypertension   PCP: Reginia Forts, MD  HPI  [name] is a 38 y.o. female who presents to office for a follow up for hypertension  1. HTN: She checks her blood pressure at home and states that it has been 160s/108 at home at its highest. She states that it has been stressful at school with accreditation going on. She states that it was over as of last week so the stress levels have been going down. She only checks the BP at home when she feels bad. She does not think that she needs an increase in her medication so she states that she will start to check her BP weekly and keep a log. She is walking two miles a day but has not noticed any weight change. She walks with her 34 year old son.   2. Social/Mental Health: She moved at the end of October into a new house. It still needs some cosmetic work but she enjoys the new house. She gets out of school in the beginning of June. School restarts in early August. Her daughter just turned five. She would like another child but her husband doesn't. She states she is emotionally stable and is not having irritable or sad days.  Patient reports good compliance with medication, good tolerance to medication, and good symptom control.      3. Palpitations/Tachycardia: She had one episode of her heart racing that occurred a week ago after not taking her Metoprolol for a weekend. She ran out of the medication on a Friday and was unable to get a refill over the weekend. She states she had chest pain on Monday but it was also accreditation day at her school so she was under a lot of stress. She told the pharmacist about the pain and he attributed it to not taking her  medication for a weekend. She denies any issues since staring the medication again. She has been unable to wear her rings due to increased swelling from the weather change. She is no longer drinking soft drinks and is only drinking water. She denies chest pain when walking with her son.   4. Missed Menstrual Cycle: She is 22 days late in her menstrual cycle. She states that she was having a similar episode and reported it to her OB-GYN, who gave her a medication. This allowed her to have a light period but since this she has not had a menstrual cycle. She has not follow up with her physician to tell him she was late. She has always had regular periods until October. She had an US performed twice that was inconclusive. She has not done a pregnancy test but does not believe she is pregnant. She has back pain that she believes is due to her issues with irregular menstrual cycle. She has been seen by a Chiropractor for the pain but does not believe the issue is with her back. He has done x-rays that have come back normal. The adjustments have helped with other back pain but not the one she believes is due to her menstrual cycle. She denies suprapubic tenderness with the back pain.    Patient  Active Problem List   Diagnosis Date Noted  . HYPERTENSION 03/06/2008  . GERD 03/06/2008  . CHRONIC HEADACHES 03/06/2008  . DEPRESSION 10/22/2007  . NEPHROLITHIASIS, HX OF 10/22/2007  . PRODUCTIVE COUGH 07/03/2007   Past Medical History  Diagnosis Date  . Allergy   . Depression   . Chronic kidney disease   . Anxiety    Past Surgical History  Procedure Laterality Date  . Cholecystectomy     Allergies  Allergen Reactions  . Hctz [Hydrochlorothiazide]     Feels bad on this medication/ has headache.   Prior to Admission medications   Medication Sig Start Date End Date Taking? Authorizing Provider  levothyroxine (SYNTHROID, LEVOTHROID) 50 MCG tablet Take 1 tablet (50 mcg total) by mouth daily before  breakfast. 08/22/13  Yes Wardell Honour, MD  metoprolol succinate (TOPROL-XL) 50 MG 24 hr tablet 1 tablet daily.  Take with or immediately following a meal. 08/20/13  Yes Wardell Honour, MD  sertraline (ZOLOFT) 50 MG tablet Take 1 tablet (50 mg total) by mouth daily. 08/20/13  Yes Wardell Honour, MD  spironolactone (ALDACTONE) 25 MG tablet Take 1 tablet (25 mg total) by mouth daily. 08/20/13  Yes Wardell Honour, MD   History   Social History  . Marital Status: Married    Spouse Name: N/A    Number of Children: 2  . Years of Education: N/A   Occupational History  . 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 Smoking status: Never Smoker   . Smokeless tobacco: Not on file  . Alcohol Use: No  . Drug Use: No  . Sexual Activity: Yes   Other Topics Concern  . Not on file   Social History Narrative   Marital status: married      Children: two (son 1 yo and daughter 43 yo)      Lives: with husband, two children.      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  HENT: Negative for drooling.   Eyes: Negative for discharge.  Respiratory: Negative for cough and shortness of breath.   Cardiovascular: Negative for chest pain, palpitations and leg swelling.  Gastrointestinal: Negative for vomiting and abdominal pain.  Endocrine: Negative for polyuria.  Genitourinary: Positive for menstrual problem. Negative for hematuria and vaginal bleeding.  Musculoskeletal: Positive for back pain and joint swelling. Negative for gait problem.  Skin: Negative for rash.  Allergic/Immunologic: Negative for immunocompromised state.  Neurological: Negative for dizziness, speech difficulty, weakness, light-headedness, numbness and headaches.  Hematological: Negative for adenopathy.    Psychiatric/Behavioral: Negative for confusion, sleep disturbance and dysphoric mood. The patient is not nervous/anxious.        Objective:   Physical Exam  Nursing note and vitals reviewed. Constitutional: She is oriented to person, place, and time. She appears well-developed and well-nourished. No distress.  HENT:  Head: Normocephalic and atraumatic.  Right Ear: External ear normal.  Left Ear: External ear normal.  Nose: Nose normal.  Mouth/Throat: Oropharynx is clear and moist.  Eyes: Conjunctivae and EOM are normal. Pupils are equal, round, and reactive to light.  Neck: Normal range of motion. Neck supple. No tracheal deviation present. No thyromegaly present.  Cardiovascular: Normal  rate, regular rhythm and intact distal pulses.  Exam reveals no gallop and no friction rub.   No murmur heard. Pulmonary/Chest: Effort normal and breath sounds normal. No respiratory distress. She has no wheezes. She has no rales.  Abdominal: Soft. Bowel sounds are normal. She exhibits no distension and no mass. There is no tenderness. There is no rebound and no guarding.  Musculoskeletal: Normal range of motion.       Lumbar back: She exhibits normal range of motion, no tenderness, no pain and no spasm.  Lymphadenopathy:    She has no cervical adenopathy.  Neurological: She is alert and oriented to person, place, and time.  Skin: Skin is warm and dry. She is not diaphoretic.  Psychiatric: She has a normal mood and affect. Her behavior is normal.       Assessment & Plan:  Essential hypertension, benign - Plan: CBC with Differential, COMPLETE METABOLIC PANEL WITH GFR, T4, free, metoprolol succinate (TOPROL-XL) 50 MG 24 hr tablet  Unspecified hypothyroidism - Plan: TSH, T4, free  Generalized anxiety disorder - Plan: sertraline (ZOLOFT) 50 MG tablet  1. HTN: moderate control; pt would like to monitor BP closely at home; goal BP 110-130/70-80.  Consider increasing Metoprolol dose at next visit if  remains elevated; obtain labs.  Pt reluctant to increasing Spironolactone at this time. 2.  Hypothyroidism subclinical:  Stable; increased Synthroid at last visit; repeat labs.   3.  Anxiety with depression: stable despite multiple work stressors; no change in therapy. 4.  Irregular menses: new; recommend contacting gynecology regarding irregular menses.   5. Low back pain: recurrent; appears multifactorial.  Followed by chiropractor and gynecology.  Meds ordered this encounter  Medications  . levothyroxine (SYNTHROID, LEVOTHROID) 50 MCG tablet    Sig: Take 1 tablet (50 mcg total) by mouth daily before breakfast.    Dispense:  30 tablet    Refill:  5  . metoprolol succinate (TOPROL-XL) 50 MG 24 hr tablet    Sig: 1 tablet daily.  Take with or immediately following a meal.    Dispense:  30 tablet    Refill:  5  . sertraline (ZOLOFT) 50 MG tablet    Sig: Take 1 tablet (50 mg total) by mouth daily.    Dispense:  30 tablet    Refill:  5  . spironolactone (ALDACTONE) 25 MG tablet    Sig: Take 1 tablet (25 mg total) by mouth daily.    Dispense:  30 tablet    Refill:  5    I personally performed the services described in this documentation, which was scribed in my presence.  The recorded information has been reviewed and is accurate.  Reginia Forts, M.D.  [facility] [address] [place], [place]  [address] [phone] phone [phone] fax

## 2014-02-02 NOTE — Patient Instructions (Signed)
1. Check blood pressure daily for two weeks. 2.  Email me with readings in two weeks.

## 2014-02-04 ENCOUNTER — Encounter: Payer: Self-pay | Admitting: Family Medicine

## 2014-02-12 ENCOUNTER — Ambulatory Visit (INDEPENDENT_AMBULATORY_CARE_PROVIDER_SITE_OTHER): Payer: BC Managed Care – PPO | Admitting: Family Medicine

## 2014-02-12 ENCOUNTER — Ambulatory Visit: Payer: BC Managed Care – PPO

## 2014-02-12 ENCOUNTER — Encounter: Payer: Self-pay | Admitting: Family Medicine

## 2014-02-12 VITALS — BP 160/110 | HR 99 | Temp 98.3°F | Resp 16 | Ht 67.0 in | Wt 260.0 lb

## 2014-02-12 DIAGNOSIS — L237 Allergic contact dermatitis due to plants, except food: Secondary | ICD-10-CM

## 2014-02-12 DIAGNOSIS — N200 Calculus of kidney: Secondary | ICD-10-CM

## 2014-02-12 DIAGNOSIS — I10 Essential (primary) hypertension: Secondary | ICD-10-CM

## 2014-02-12 DIAGNOSIS — L255 Unspecified contact dermatitis due to plants, except food: Secondary | ICD-10-CM

## 2014-02-12 LAB — POCT UA - MICROSCOPIC ONLY
CASTS, UR, LPF, POC: NEGATIVE
Crystals, Ur, HPF, POC: NEGATIVE
YEAST UA: NEGATIVE

## 2014-02-12 LAB — POCT URINALYSIS DIPSTICK
BILIRUBIN UA: NEGATIVE
Glucose, UA: NEGATIVE
Ketones, UA: NEGATIVE
NITRITE UA: NEGATIVE
PH UA: 7
PROTEIN UA: NEGATIVE
Spec Grav, UA: 1.02
UROBILINOGEN UA: 0.2

## 2014-02-12 LAB — POCT URINE PREGNANCY: PREG TEST UR: NEGATIVE

## 2014-02-12 MED ORDER — METOPROLOL SUCCINATE ER 50 MG PO TB24: ORAL_TABLET | ORAL | Status: DC

## 2014-02-12 NOTE — Patient Instructions (Signed)
1.  Increase Metoprolol ER 50mg  to 1.5 tablets daily. 2.  Decrease Prednisone to 2 tablets daily. 3.  ADD Zyrtec or Claritin 10mg  one daily.   4. Continue the Benadryl at bedtime for itching. 5.  Call immediately if kidney stone symptoms return and we will refer you to urology and schedule CT scan. 6. ADD protein to each meal or snack.  Goal of 60 grams of protein per day.  (eggs, nuts, PB, cheese

## 2014-02-12 NOTE — Progress Notes (Addendum)
Subjective:    Patient ID: [name], female    DOB: Dec 31, 1975, 38 y.o.   MRN: [id number]  HPI This chart was scribed for Adolfo Granieri-MD by Celesta Gentile, Scribe. This patient was seen in room 5 and the patient's care was started at 4:57 PM.  HPI Comments: [name] is a 38 y.o. female who presents to the [facility] complaining of an allergic reaction to poison ivy.  Pt states she contracted the poison ivy about 9 days ago in her back yard.  She states she has had this type of allergic reaction before, but states the areas of irritation are worse than any prior episode.  She states she has tried benadryl with some relief. Pt was evaluated at a walk in clinic in [place]; prescribed Prednisone.    Pt was taking prednisone 5 times daily without much relief.  She states while taking the prednisone her BP has been extremely high today.  Pt's BP was 160/110 at today's visit.    Pt also reports after her visit here on Monday she started having urinary urgency with associated R low back pain.  Pt states she had episodes of emesis and nausea.  She denies fever and chills.  She decided to visit the walk in clinic in [place] on Tuesday, where she was diagnosed with a kidney stone.  She reports the urine test showed blood, but currently denies visible blood.  She states she was prescribed doxycycline and hydrocodone; no xray obtained.  She states she has been using the urine strainer, but denies passing a visible stone.  She reports the urinary symptoms and back pain have subsided completely for the past four days.  She states she was informed to follow up with her PCP.  Pt states her last kidney stone was about 13 years ago; passed stone spontaneously;no previous urology consultation.    Obesity: has been working on weight loss without any results.  She has not been walking this week but has been walking prior. B:  Banana, cheerios; water.   Snack:  PB Crackers or apples,  water. Lunch:  Grapefruit, Mt.Dew. Snack:  None Supper:  Tacos (1), water, salad.  Past Medical History  Diagnosis Date  . Allergy   . Depression   . Chronic kidney disease   . Anxiety   . Hypertension     Past Surgical History  Procedure Laterality Date  . Cholecystectomy      Family History  Problem Relation Age of Onset  . Diabetes Mother   . Heart disease Mother     CAD with stenting  . Hypertension Mother   . Hyperlipidemia Mother   . Hypothyroidism Mother   . Heart disease Father   . Heart disease Maternal Grandmother   . Stroke Maternal Grandmother   . Heart disease Paternal Grandmother   . Heart disease Paternal Grandfather     History   Social History  . Marital Status: Married    Spouse Name: N/A    Number of Children: 2  . Years of Education: N/A   Occupational History  . 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 Smoking status: Never Smoker   . Smokeless tobacco: Not on file  . Alcohol Use: No  . Drug Use: No  . Sexual Activity: Yes   Other Topics Concern  . Not on file   Social History Narrative   Marital status:  married; separated from husband in 03/2014.        Children: two (son 20 yo and daughter 64 yo)      Lives: with two children.      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 Hctz [Hydrochlorothiazide]     Feels bad on this medication/ has headache.    Patient Active Problem List   Diagnosis Date Noted  . HYPERTENSION 03/06/2008  . GERD 03/06/2008  . CHRONIC HEADACHES 03/06/2008  . DEPRESSION 10/22/2007  . NEPHROLITHIASIS, HX OF 10/22/2007   Review of Systems  Constitutional: Negative for fever, chills, diaphoresis and fatigue.  Eyes: Negative for visual disturbance.  Respiratory: Negative for cough, shortness of breath and wheezing.     Cardiovascular: Negative for chest pain, palpitations and leg swelling.  Gastrointestinal: Negative for nausea, vomiting, abdominal pain, diarrhea and constipation.  Endocrine: Negative for cold intolerance, heat intolerance, polydipsia, polyphagia and polyuria.  Genitourinary: Negative for dysuria, urgency, frequency, hematuria, flank pain and difficulty urinating.  Musculoskeletal: Negative for myalgias and back pain.  Skin: Positive for rash. Negative for color change.  Neurological: Negative for dizziness, tremors, seizures, syncope, facial asymmetry, speech difficulty, weakness, light-headedness, numbness and headaches.  Psychiatric/Behavioral: Negative for behavioral problems and confusion.      Objective:   Physical Exam  Constitutional: She is oriented to person, place, and time. She appears well-developed and well-nourished. No distress.  HENT:  Head: Normocephalic and atraumatic.  Right Ear: External ear normal.  Left Ear: External ear normal.  Nose: Nose normal.  Mouth/Throat: Oropharynx is clear and moist.  Eyes: Conjunctivae and EOM are normal. Pupils are equal, round, and reactive to light. Right eye exhibits no discharge. Left eye exhibits no discharge.  Neck: Normal range of motion. Neck supple. Carotid bruit is not present. No tracheal deviation present. No thyromegaly present.  Cardiovascular: Normal rate, regular rhythm, normal heart sounds and intact distal pulses.  Exam reveals no gallop and no friction rub.   No murmur heard. Pulmonary/Chest: Effort normal and breath sounds normal. No respiratory distress. She has no wheezes. She has no rales.  Abdominal: Soft. Bowel sounds are normal. She exhibits no distension and no mass. There is no tenderness. There is no rebound and no guarding.  Musculoskeletal: Normal range of motion.  Lymphadenopathy:    She has no cervical adenopathy.  Neurological: She is alert and oriented to person, place, and time. No cranial nerve  deficit.  Skin: Skin is warm and dry. No rash noted. She is not diaphoretic. No erythema. No pallor.  Left medial ankle with 3 cm diameter of SR with erythema.  Multiple clusters of vesicles.  Scattered vesicles on ventral side.    Psychiatric: She has a normal mood and affect. Her behavior is normal.  Nursing note and vitals reviewed.   Filed Vitals:   02/12/14 1638  BP: 160/110  Pulse: 99  Temp: 98.3 F (36.8 C)  Resp: 16  Height: 5\' 7"  (1.702 m)  Weight: 260 lb (117.935 kg)  SpO2: 97%     Results for orders placed or performed in visit on 02/12/14  Urine culture  Result Value Ref Range   Colony Count NO GROWTH    Organism ID, Bacteria NO GROWTH   POCT urinalysis dipstick  Result Value Ref Range  Color, UA yellow    Clarity, UA cloudy    Glucose, UA neg    Bilirubin, UA neg    Ketones, UA neg    Spec Grav, UA 1.020    Blood, UA small    pH, UA 7.0    Protein, UA neg    Urobilinogen, UA 0.2    Nitrite, UA neg    Leukocytes, UA small (1+)   POCT UA - Microscopic Only  Result Value Ref Range   WBC, Ur, HPF, POC 6-18    RBC, urine, microscopic 4-12    Bacteria, U Microscopic 1+    Mucus, UA trace    Epithelial cells, urine per micros 3-8    Crystals, Ur, HPF, POC neg    Casts, Ur, LPF, POC neg    Yeast, UA neg   POCT urine pregnancy  Result Value Ref Range   Preg Test, Ur Negative    [facility] reading (PRIMARY) by  Dr. Tamala Julian. KUB:  NAD      Assessment & Plan:  Nephrolithiasis - Plan: POCT urinalysis dipstick, POCT UA - Microscopic Only, Urine culture, DG Abd 1 View, POCT urine pregnancy  Poison ivy dermatitis  Essential hypertension, benign - Plan: DISCONTINUED: metoprolol succinate (TOPROL-XL) 50 MG 24 hr tablet   1. Nephrolithiasis: New.  Pt declines CT abdomen to evaluate location of stone since symptoms have resolved; pt also declines referral to urology at this time; continue straining urine; continue Flomax provided to take for next 2-4 weeks. 2.   Poison Ivy Dermatitis: New.  Continue Prednisone therapy; recommend Claritin or Zyrtec daily; also recommend Benadryl qhs. 3.  HTN: uncontrolled; increase Metoprolol 50mg  to 1.5 tablets daily; expect BP to improve when stops Prednisone. 4. Obesity: encourage exercise when clinically improves from above acute issues.  Avoid sweetened beverages.   Meds ordered this encounter  Medications  . DISCONTD: doxycycline (VIBRAMYCIN) 100 MG capsule    Sig: Take 100 mg by mouth 2 (two) times daily.  Marland Kitchen DISCONTD: HYDROcodone-acetaminophen (NORCO/VICODIN) 5-325 MG per tablet    Sig: Take 1 tablet by mouth every 6 (six) hours as needed for moderate pain.  Marland Kitchen DISCONTD: predniSONE (DELTASONE) 10 MG tablet    Sig: Take 10 mg by mouth. 5 tab qd  . DISCONTD: amoxicillin (AMOXIL) 875 MG tablet    Sig: Take 875 mg by mouth 2 (two) times daily.  Marland Kitchen DISCONTD: tamsulosin (FLOMAX) 0.4 MG CAPS capsule    Sig: Take 0.4 mg by mouth.  . DISCONTD: metoprolol succinate (TOPROL-XL) 50 MG 24 hr tablet    Sig: 1.5 tablets daily.  Take with or immediately following a meal.    Dispense:  45 tablet    Refill:  5    I personally performed the services described in this documentation, which was scribed in my presence.  The recorded information has been reviewed and is accurate.  Reginia Forts, M.D.  [facility] [address] [place], [place]  [address] [phone] phone [phone] fax

## 2014-02-13 LAB — URINE CULTURE
COLONY COUNT: NO GROWTH
ORGANISM ID, BACTERIA: NO GROWTH

## 2014-02-26 ENCOUNTER — Encounter: Payer: Self-pay | Admitting: Family Medicine

## 2014-03-18 ENCOUNTER — Encounter: Payer: Self-pay | Admitting: Family Medicine

## 2014-06-08 ENCOUNTER — Ambulatory Visit (INDEPENDENT_AMBULATORY_CARE_PROVIDER_SITE_OTHER): Payer: BC Managed Care – PPO | Admitting: Family Medicine

## 2014-06-08 ENCOUNTER — Encounter: Payer: Self-pay | Admitting: Family Medicine

## 2014-06-08 VITALS — BP 144/93 | HR 77 | Temp 98.5°F | Resp 18 | Ht 66.0 in | Wt 263.4 lb

## 2014-06-08 DIAGNOSIS — N3941 Urge incontinence: Secondary | ICD-10-CM

## 2014-06-08 DIAGNOSIS — Z87442 Personal history of urinary calculi: Secondary | ICD-10-CM

## 2014-06-08 DIAGNOSIS — E039 Hypothyroidism, unspecified: Secondary | ICD-10-CM

## 2014-06-08 DIAGNOSIS — G4700 Insomnia, unspecified: Secondary | ICD-10-CM

## 2014-06-08 DIAGNOSIS — I10 Essential (primary) hypertension: Secondary | ICD-10-CM

## 2014-06-08 DIAGNOSIS — F411 Generalized anxiety disorder: Secondary | ICD-10-CM

## 2014-06-08 DIAGNOSIS — F329 Major depressive disorder, single episode, unspecified: Secondary | ICD-10-CM

## 2014-06-08 DIAGNOSIS — F3289 Other specified depressive episodes: Secondary | ICD-10-CM

## 2014-06-08 LAB — POCT UA - MICROSCOPIC ONLY
CASTS, UR, LPF, POC: NEGATIVE
Crystals, Ur, HPF, POC: NEGATIVE
MUCUS UA: POSITIVE
YEAST UA: NEGATIVE

## 2014-06-08 LAB — POCT URINALYSIS DIPSTICK
Bilirubin, UA: NEGATIVE
Glucose, UA: NEGATIVE
KETONES UA: NEGATIVE
Nitrite, UA: NEGATIVE
PROTEIN UA: NEGATIVE
SPEC GRAV UA: 1.02
Urobilinogen, UA: 0.2
pH, UA: 7.5

## 2014-06-08 LAB — T4, FREE: Free T4: 1.09 ng/dL (ref 0.80–1.80)

## 2014-06-08 LAB — TSH: TSH: 3.096 u[IU]/mL (ref 0.350–4.500)

## 2014-06-08 MED ORDER — TOLTERODINE TARTRATE ER 2 MG PO CP24: 2.0000 mg | ORAL_CAPSULE | Freq: Every day | ORAL | Status: DC

## 2014-06-08 MED ORDER — METOPROLOL SUCCINATE ER 100 MG PO TB24: ORAL_TABLET | ORAL | Status: DC

## 2014-06-08 MED ORDER — LORAZEPAM 0.5 MG PO TABS: 0.5000 mg | ORAL_TABLET | Freq: Every day | ORAL | Status: DC | PRN

## 2014-06-08 MED ORDER — SERTRALINE HCL 100 MG PO TABS: 100.0000 mg | ORAL_TABLET | Freq: Every day | ORAL | Status: DC

## 2014-06-08 NOTE — Progress Notes (Signed)
Subjective:  This chart was scribed for Wardell Honour, MD by Thea Alken, ED Scribe. This patient was seen in room 25 and the patient's care was started at 10:57 AM.  Patient ID: [name], female    DOB: 11/30/1975, 38 y.o.   MRN: [id number]  HPI  Chief Complaint  Patient presents with  . Follow-up    medication review for bp meds  . Hypertension  . Anxiety   HPI Comments: [name] is a 38 y.o. female who presents to the [facility] for a f/u regarding anxiety, HTN and hypothyroidism  medication. Last seen an April 2015 for poison ivy, right lower back pain and urinary urgency with concern for kidney stone. 3 stones on KUB. Pt BP ws elevated during that visit, metoprolol was increased to 1.5 tablets daily.  HTN: Patient reports good compliance with medication, good tolerance to medication, and good symptom control.  Continues to have borderline elevated blood pressures.  Needs to stay healthy to take care of children.    Anxiety and depression: husband left on father's day in June.  Wants time away and to think.  Pt has had a very difficult summer; has spent a lot of time in bed. Has not told anyone about husband leaving; parents do not even know that husband has left.  Good relationship with inlaws.  Now school is starting and patient is having to be a single parent.  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  Denies SI/HI.  "I've lost my best friend".  Patient reports good compliance with medication, good tolerance to medication, and good symptom control.    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  No dysuria, gross hematuria. No further flank pain;no hematuria since last visit. No recent issues.  Does not want to undergo urological evaluation currently due to school starting and due to  various personal issues.  Would love a medication to control symptoms.  No vaginal discharge or vaginal irritation.   Hypothyroidism:  Patient reports good compliance with medication, good tolerance to medication, and good symptom control.     Past Medical History  Diagnosis Date  . Allergy   . Depression   . Chronic kidney disease   . Anxiety    Past Surgical History  Procedure Laterality Date  . Cholecystectomy     Prior to Admission medications   Medication Sig Start Date End Date Taking? Authorizing Provider  HYDROcodone-acetaminophen (NORCO/VICODIN) 5-325 MG per tablet Take 1 tablet by mouth every 6 (six) hours as needed for moderate pain.    Historical Provider, MD  levothyroxine (SYNTHROID, LEVOTHROID) 50 MCG tablet Take 1 tablet (50 mcg total) by mouth daily before breakfast. 02/02/14  Yes Wardell Honour, MD  metoprolol succinate (TOPROL-XL) 50 MG 24 hr tablet 1.5 tablets daily.  Take with or immediately following a meal. 02/12/14  Yes Wardell Honour, MD  predniSONE (DELTASONE) 10 MG tablet Take 10 mg by mouth. 5 tab qd    Historical Provider, MD  sertraline (ZOLOFT) 50 MG tablet Take 1 tablet (50 mg total) by mouth daily. 02/02/14  Yes Wardell Honour, MD  spironolactone (ALDACTONE) 25 MG tablet Take 1 tablet (25 mg total) by mouth daily. 02/02/14  Yes Wardell Honour, MD  tamsulosin (FLOMAX) 0.4 MG CAPS capsule Take 0.4 mg by mouth.  Historical Provider, MD   History   Social History  . Marital Status: Married    Spouse Name: N/A    Number of Children: 2  . Years of Education: N/A   Occupational History  . 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 Smoking status: Never Smoker   . Smokeless tobacco: Not on file  . Alcohol Use: No  . Drug Use: No  . Sexual Activity: Yes   Other Topics Concern  . Not on file   Social History Narrative   Marital status: married; separated from husband in 03/2014.        Children: two (son 58  yo and daughter 35 yo)      Lives: with two children.      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  Eyes: Negative for visual disturbance.  Respiratory: Negative for cough and shortness of breath.   Cardiovascular: Negative for chest pain, palpitations and leg swelling.  Gastrointestinal: Negative for nausea, vomiting, abdominal pain, diarrhea and constipation.  Endocrine: Negative for cold intolerance, heat intolerance, polydipsia, polyphagia and polyuria.  Genitourinary: Positive for urgency and frequency. Negative for dysuria, vaginal discharge, vaginal pain and pelvic pain.  Neurological: Negative for dizziness, tremors, seizures, syncope, facial asymmetry, speech difficulty, weakness, light-headedness, numbness and headaches.  Psychiatric/Behavioral: Positive for sleep disturbance, dysphoric mood and decreased concentration. Negative for suicidal ideas and self-injury. The patient is nervous/anxious.     Objective:   Physical Exam  Nursing note and vitals reviewed. Constitutional: She is oriented to person, place, and time. She appears well-developed and well-nourished. No distress.  HENT:  Head: Normocephalic and atraumatic.  Right Ear: External ear normal.  Left Ear: External ear normal.  Nose: Nose normal.  Mouth/Throat: Oropharynx is clear and moist.  Eyes: Conjunctivae and EOM are normal. Pupils are equal, round, and reactive to light.  Neck: Normal range of motion. Neck supple. Carotid bruit is not present. No thyromegaly present.  Cardiovascular: Normal rate, regular rhythm, normal heart sounds and intact distal pulses.  Exam reveals no gallop and no friction rub.   No murmur heard. Pulmonary/Chest: Effort normal and breath sounds normal. She has no wheezes. She has no rales.  Abdominal:  Soft. Bowel sounds are normal. She exhibits no distension and no mass. There is no tenderness. There is no rebound and no guarding.  Musculoskeletal: Normal range of motion.  Lymphadenopathy:    She has no cervical adenopathy.  Neurological: She is alert and oriented to person, place, and time. No cranial nerve deficit.  Skin: Skin is warm and dry. No rash noted. She is not diaphoretic. No erythema. No pallor.  Psychiatric: She has a normal mood and affect. Her behavior is normal.  Tearful.   Filed Vitals:   06/08/14 1051  BP: 144/93  Pulse: 77  Temp: 98.5 F (36.9 C)  Resp: 18    Assessment & Plan:  Essential hypertension, benign - Plan: Comprehensive metabolic panel, metoprolol succinate (TOPROL-XL) 100 MG 24 hr tablet  Unspecified hypothyroidism - Plan: TSH, T4, free  NEPHROLITHIASIS, HX OF  DEPRESSION  Anxiety state, unspecified - Plan: LORazepam (ATIVAN) 0.5 MG tablet  Insomnia - Plan: LORazepam (ATIVAN) 0.5 MG tablet  Urge incontinence - Plan: POCT urinalysis dipstick, POCT UA - Microscopic Only, Urine culture, tolterodine (DETROL LA) 2 MG 24 hr capsule  Generalized anxiety disorder - Plan: sertraline (ZOLOFT) 100 MG tablet  1. HTN: moderately controlled; increase Metoprolol ER to 100mg  daily.  2.  Anxiety with depression: worsening due to separation with husband; counseling provided; increase Zoloft to 100mg  daily; rx for Lorazepam to take PRN. Highly recommend psychotherapy to help with coping strategies during this very difficult time. 3.  Urge Incontinence: New.  Send urine culture.  rx for Derol LA 2mg  daily provided.  Pt declined referral to urology at this time due to various personal issues going on.   4.  Hypothyroidism: stable; obtain labs; adjust medication as warranted. 5. History of nephrolithiasis: stable; asymptomatic; repeat u/a to confirm that blood has resolved.  Meds ordered this encounter  Medications  . LORazepam (ATIVAN) 0.5 MG tablet    Sig:  Take 1 tablet (0.5 mg total) by mouth daily as needed for anxiety.    Dispense:  30 tablet    Refill:  1  . sertraline (ZOLOFT) 100 MG tablet    Sig: Take 1 tablet (100 mg total) by mouth daily.    Dispense:  30 tablet    Refill:  5  . metoprolol succinate (TOPROL-XL) 100 MG 24 hr tablet    Sig: One tablet daily.  Take with or immediately following a meal.    Dispense:  30 tablet    Refill:  5  . tolterodine (DETROL LA) 2 MG 24 hr capsule    Sig: Take 1 capsule (2 mg total) by mouth daily.    Dispense:  30 capsule    Refill:  5    I personally performed the services described in this documentation, which was scribed in my presence.  The recorded information has been reviewed and is accurate.  Reginia Forts, M.D.  [facility] [address] [place], [place]  [address] [phone] phone [phone] fax

## 2014-06-09 LAB — COMPREHENSIVE METABOLIC PANEL
ALK PHOS: 91 U/L (ref 39–117)
ALT: 25 U/L (ref 0–35)
AST: 19 U/L (ref 0–37)
Albumin: 4.5 g/dL (ref 3.5–5.2)
BUN: 7 mg/dL (ref 6–23)
CO2: 23 mEq/L (ref 19–32)
CREATININE: 0.65 mg/dL (ref 0.50–1.10)
Calcium: 9 mg/dL (ref 8.4–10.5)
Chloride: 103 mEq/L (ref 96–112)
Glucose, Bld: 83 mg/dL (ref 70–99)
Potassium: 4.3 mEq/L (ref 3.5–5.3)
Sodium: 137 mEq/L (ref 135–145)
Total Bilirubin: 0.6 mg/dL (ref 0.2–1.2)
Total Protein: 7.1 g/dL (ref 6.0–8.3)

## 2014-06-09 LAB — URINE CULTURE: Colony Count: >=100000

## 2014-06-27 ENCOUNTER — Encounter: Payer: Self-pay | Admitting: Family Medicine

## 2014-07-01 ENCOUNTER — Telehealth: Payer: Self-pay

## 2014-07-01 NOTE — Telephone Encounter (Signed)
Dr.Smith, Pt states that the alprazelam and zoloft are not working for her, she has an appt scheduled with you 07/13/14 @4 :00 but was wanting to speak with you regarding this before then. [phone]

## 2014-07-03 NOTE — Telephone Encounter (Signed)
Pt reported that she is still having a lot of trouble w/panic attacks. She was quite anxious and slightly teary on the phone and I advised her that we are happy to see her bf her appt if she feels that she needs help sooner. Advised that we do not want her to feel that she doesn't have any one who can help her if Dr Tamala Julian is not in. Pt voiced understanding.  Pt stated she doesn't feel that the inc in zoloft has helped her yet. She has been taking the lorazepam only at night as Rxd and it is helping her get to sleep (so that is an improvement), but she usually wakes up around 2 am and can not get back to sleep. Pt seems to be most concerned about panic attacks she is having during the day, and she is not sure if she can take any lorazepam when she is feeling anxious since she takes 1 at night. Dr Tamala Julian, please advise.

## 2014-07-03 NOTE — Telephone Encounter (Signed)
Please call the patient for details.

## 2014-07-07 NOTE — Telephone Encounter (Signed)
Lm for rtn call 

## 2014-07-07 NOTE — Telephone Encounter (Signed)
Please call patient --- OK to take 1/2 to 1 Lorazepam during the day as needed.  I would start with 1/2 tablet of Lorazepam as needed during the day because it is sedating.  It will take the Zoloft 3-4 weeks to take full effect.

## 2014-07-08 NOTE — Telephone Encounter (Signed)
Spoke to pt- she will give the Zoloft some time to take effect. Pt will try only 1/2 of the Lorazepam during the day to see it that helps with some of her panic attacks.

## 2014-07-08 NOTE — Telephone Encounter (Signed)
Lm for rtn call 

## 2014-07-13 ENCOUNTER — Ambulatory Visit: Payer: BC Managed Care – PPO | Admitting: Family Medicine

## 2014-08-14 ENCOUNTER — Other Ambulatory Visit: Payer: Self-pay

## 2014-09-21 ENCOUNTER — Encounter: Payer: Self-pay | Admitting: Family Medicine

## 2014-09-21 ENCOUNTER — Ambulatory Visit (INDEPENDENT_AMBULATORY_CARE_PROVIDER_SITE_OTHER): Payer: BC Managed Care – PPO | Admitting: Family Medicine

## 2014-09-21 VITALS — BP 158/96 | HR 87 | Temp 97.7°F | Resp 16 | Ht 66.0 in | Wt 260.8 lb

## 2014-09-21 DIAGNOSIS — F32A Depression, unspecified: Secondary | ICD-10-CM

## 2014-09-21 DIAGNOSIS — I10 Essential (primary) hypertension: Secondary | ICD-10-CM

## 2014-09-21 DIAGNOSIS — F419 Anxiety disorder, unspecified: Secondary | ICD-10-CM

## 2014-09-21 DIAGNOSIS — E039 Hypothyroidism, unspecified: Secondary | ICD-10-CM

## 2014-09-21 DIAGNOSIS — N3941 Urge incontinence: Secondary | ICD-10-CM

## 2014-09-21 DIAGNOSIS — F418 Other specified anxiety disorders: Secondary | ICD-10-CM

## 2014-09-21 DIAGNOSIS — M542 Cervicalgia: Secondary | ICD-10-CM

## 2014-09-21 DIAGNOSIS — F329 Major depressive disorder, single episode, unspecified: Secondary | ICD-10-CM

## 2014-09-21 MED ORDER — TOLTERODINE TARTRATE ER 4 MG PO CP24
4.0000 mg | ORAL_CAPSULE | Freq: Every day | ORAL | Status: DC
Start: 2014-09-21 — End: 2016-12-16

## 2014-09-21 MED ORDER — PREDNISONE 20 MG PO TABS: ORAL_TABLET | ORAL | Status: DC

## 2014-09-21 MED ORDER — METHOCARBAMOL 500 MG PO TABS: 500.0000 mg | ORAL_TABLET | Freq: Every evening | ORAL | Status: DC | PRN

## 2014-09-26 NOTE — Progress Notes (Signed)
Subjective:    Patient ID: [name], female    DOB: December 02, 1975, 38 y.o.   MRN: [id number]  09/21/2014  Follow-up and Hypertension   HPI This 38 y.o. female presents for three month follow-up:  1. Anxiety and depression: increased Zoloft to 285m daily after last visit.  Husband left on Father's Day and returned home yesterday.  Husband had a mid-life crisis and would not even talk with his parents. Pt had to tell her parents when school started because she needed help.  Pt has a strong support team from teacher's at school; pt has really leaned on friends for support.  Met with preacher x 2 but husband did not want to come home so counseling really didn't seem to help patient.  Has recently decreased Zoloft to one tablet daily.  Lorazepam did not help pt at all.  Had been taking Xanax which really helped with panic attacks that started occurring after last visit.    2. HTN: has not been compliant with daily medication.  Not taking Spironolactone; only taking Metoprolol for blood pressure; not checking BP at home. Denies HA/dizziness/focal weakness/paresthesias.  Denies CP/palp/SOB/leg swelling.  3.  Hypothyroidism: has not been compliant with daily medication; feels well; denies excessive fatigue.  4.  Overactive bladder/urge incontinence: Detrol LA 237mhas not helped much at all; pt very concerned with persistent urinary leakage.  Denies dysuria, frequency.  5.  Neck pain/R shoulder pain:  Onset one month ago.  +neck pain; +pain radiating into R shoulder; no n/t/w in RUE.     Review of Systems  Constitutional: Negative for fever, chills, diaphoresis and fatigue.  Eyes: Negative for visual disturbance.  Respiratory: Negative for cough and shortness of breath.   Cardiovascular: Negative for chest pain, palpitations and leg swelling.  Gastrointestinal: Negative for nausea, vomiting, abdominal pain, diarrhea and constipation.  Endocrine: Negative for cold intolerance, heat  intolerance, polydipsia, polyphagia and polyuria.  Genitourinary: Positive for urgency and frequency. Negative for dysuria and flank pain.  Musculoskeletal: Positive for myalgias, arthralgias, neck pain and neck stiffness.  Skin: Negative for rash.  Neurological: Negative for dizziness, tremors, seizures, syncope, facial asymmetry, speech difficulty, weakness, light-headedness, numbness and headaches.  Psychiatric/Behavioral: Positive for dysphoric mood. Negative for suicidal ideas, sleep disturbance and self-injury. The patient is nervous/anxious.     Past Medical History  Diagnosis Date  . Allergy   . Depression   . Chronic kidney disease   . Anxiety   . Hypertension    Past Surgical History  Procedure Laterality Date  . Cholecystectomy     Allergies  Allergen Reactions  . Hctz [Hydrochlorothiazide]     Feels bad on this medication/ has headache.   Current Outpatient Prescriptions  Medication Sig Dispense Refill  . levothyroxine (SYNTHROID, LEVOTHROID) 50 MCG tablet Take 1 tablet (50 mcg total) by mouth daily before breakfast. 30 tablet 5  . LORazepam (ATIVAN) 0.5 MG tablet Take 1 tablet (0.5 mg total) by mouth daily as needed for anxiety. 30 tablet 1  . metoprolol succinate (TOPROL-XL) 100 MG 24 hr tablet One tablet daily.  Take with or immediately following a meal. 30 tablet 5  . sertraline (ZOLOFT) 100 MG tablet Take 1 tablet (100 mg total) by mouth daily. 30 tablet 5  . tolterodine (DETROL LA) 4 MG 24 hr capsule Take 1 capsule (4 mg total) by mouth daily. 30 capsule 11  . methocarbamol (ROBAXIN) 500 MG tablet Take 1-2 tablets (500-1,000 mg total) by mouth at bedtime as  needed for muscle spasms. 40 tablet 0  . predniSONE (DELTASONE) 20 MG tablet Three tablets daily x 2 days then two tablets daily x 4 days then one tablet daily x 4 days 18 tablet 0   No current facility-administered medications for this visit.       Objective:    BP 158/96 mmHg  Pulse 87  Temp(Src) 97.7  F (36.5 C) (Oral)  Resp 16  Ht _0  (1.676 m)  Wt 260 lb 12.8 oz (118.298 kg)  BMI 42.11 kg/m2  SpO2 96%  LMP 08/21/2014 Physical Exam  Constitutional: She is oriented to person, place, and time. She appears well-developed and well-nourished. No distress.  HENT:  Head: Normocephalic and atraumatic.  Right Ear: External ear normal.  Left Ear: External ear normal.  Nose: Nose normal.  Mouth/Throat: Oropharynx is clear and moist.  Eyes: Conjunctivae and EOM are normal. Pupils are equal, round, and reactive to light.  Neck: Normal range of motion. Neck supple. Carotid bruit is not present. No thyromegaly present.  Cardiovascular: Normal rate, regular rhythm, normal heart sounds and intact distal pulses.  Exam reveals no gallop and no friction rub.   No murmur heard. Pulmonary/Chest: Effort normal and breath sounds normal. She has no wheezes. She has no rales.  Abdominal: Soft. Bowel sounds are normal. She exhibits no distension and no mass. There is no tenderness. There is no rebound and no guarding.  Musculoskeletal:       Right shoulder: She exhibits normal range of motion, no tenderness, no bony tenderness, no pain, no spasm, normal pulse and normal strength.       Cervical back: She exhibits decreased range of motion, tenderness, pain and spasm. She exhibits no bony tenderness and no swelling.  Lymphadenopathy:    She has no cervical adenopathy.  Neurological: She is alert and oriented to person, place, and time. No cranial nerve deficit.  Skin: Skin is warm and dry. No rash noted. She is not diaphoretic. No erythema. No pallor.  Psychiatric: She has a normal mood and affect. Her behavior is normal.        Assessment & Plan:   1. Neck pain   2. Urge incontinence   3. Essential hypertension   4. Anxiety and depression   5. Hypothyroidism, unspecified hypothyroidism type      1. Neck pain/strain: New. Rx for Prednisone provided; rx for Robaxin; recommend heat to area bid  for two weeks; passive ROM exercises recommended daily; avoid heavy lifting for one month. If no improvement at next visit, obtain C-spine films. 2.  Urge Incontinence: uncontrolled; increase Detrol LA to 64m daily. If no improvement with Detrol, refer to urology. 3.  HTN: uncontrolled with non-compliance with medication; no changes to management at this time. 4.  Anxiety and depression: worsened after last visit due to marital strains; now improving; continue Zoloft daily. 5.  Hypothyroidism: likely uncontrolled due to recent non-compliance with medications; pt plans to improve compliance now that stressors are improving.    Meds ordered this encounter  Medications  . predniSONE (DELTASONE) 20 MG tablet    Sig: Three tablets daily x 2 days then two tablets daily x 4 days then one tablet daily x 4 days    Dispense:  18 tablet    Refill:  0  . methocarbamol (ROBAXIN) 500 MG tablet    Sig: Take 1-2 tablets (500-1,000 mg total) by mouth at bedtime as needed for muscle spasms.    Dispense:  40 tablet  Refill:  0  . tolterodine (DETROL LA) 4 MG 24 hr capsule    Sig: Take 1 capsule (4 mg total) by mouth daily.    Dispense:  30 capsule    Refill:  11    Return in about 3 months (around 12/22/2014) for recheck.    Reginia Forts, M.D.  [facility] [address] [place], [place]  [address] [phone] phone [phone] fax

## 2014-09-29 ENCOUNTER — Telehealth: Payer: Self-pay | Admitting: *Deleted

## 2014-09-29 NOTE — Telephone Encounter (Signed)
Pt called and states that she had an appt. With dr. Tamala Julian last week and she is still having pain and numbness in her arm. She wants to know if she should come back in to be seen. Please [phone]

## 2014-09-30 NOTE — Telephone Encounter (Signed)
LM for pt to RTC- left Dr. Tamala Julian Office hours for the week.

## 2014-10-01 ENCOUNTER — Ambulatory Visit (INDEPENDENT_AMBULATORY_CARE_PROVIDER_SITE_OTHER): Payer: BC Managed Care – PPO | Admitting: Family Medicine

## 2014-10-01 ENCOUNTER — Ambulatory Visit (INDEPENDENT_AMBULATORY_CARE_PROVIDER_SITE_OTHER): Payer: BC Managed Care – PPO

## 2014-10-01 VITALS — BP 140/90 | HR 71 | Temp 98.2°F | Resp 16 | Ht 67.25 in | Wt 262.2 lb

## 2014-10-01 DIAGNOSIS — N926 Irregular menstruation, unspecified: Secondary | ICD-10-CM

## 2014-10-01 DIAGNOSIS — M542 Cervicalgia: Secondary | ICD-10-CM

## 2014-10-01 DIAGNOSIS — R319 Hematuria, unspecified: Secondary | ICD-10-CM

## 2014-10-01 DIAGNOSIS — Z87442 Personal history of urinary calculi: Secondary | ICD-10-CM

## 2014-10-01 LAB — POCT URINALYSIS DIPSTICK
Bilirubin, UA: NEGATIVE
Glucose, UA: NEGATIVE
Ketones, UA: NEGATIVE
Leukocytes, UA: NEGATIVE
Nitrite, UA: NEGATIVE
Protein, UA: NEGATIVE
SPEC GRAV UA: 1.025
UROBILINOGEN UA: 0.2
pH, UA: 6

## 2014-10-01 LAB — POCT UA - MICROSCOPIC ONLY
Bacteria, U Microscopic: NEGATIVE
Casts, Ur, LPF, POC: NEGATIVE
Mucus, UA: NEGATIVE
Yeast, UA: NEGATIVE

## 2014-10-01 LAB — POCT URINE PREGNANCY: Preg Test, Ur: NEGATIVE

## 2014-10-01 MED ORDER — HYDROCODONE-ACETAMINOPHEN 5-325 MG PO TABS: 1.0000 | ORAL_TABLET | Freq: Four times a day (QID) | ORAL | Status: DC | PRN

## 2014-10-01 MED ORDER — IBUPROFEN 800 MG PO TABS: 800.0000 mg | ORAL_TABLET | Freq: Three times a day (TID) | ORAL | Status: DC | PRN

## 2014-10-01 NOTE — Progress Notes (Signed)
Subjective:    Patient ID: [name], female    DOB: 1976-10-17, 39 y.o.   MRN: [id number]  10/01/2014  Follow-up and Arm Problem   HPI This 38 y.o. female presents for evaluation of R cervical spine pain with radiation into R arm.  Acutely worsened three days ago.  Prescribed Prednisone and Robaxin at last visit; has one more Prednisone to take.   Felt like getting better last week but acutely worsened on Monday.  Gets tingly feeling and then gets sharp pain.  Unable to hold marker to complete tasks. Constant throbbing.  Tried Tylenol without relief.  Tingling is constant.  Weakness is intermittent.  Feels weird.  Able to write normally.  Taking Robaxin every night.  LMP 08-09-2014; history of infertility; history of irregular menses.   Also suffered with nephrolithiasis in past six months; due for repeat u/a.  No recurrent flank pain, renal colic, or hematuria gorss.   Review of Systems  Constitutional: Negative for fever, chills, diaphoresis and fatigue.  Gastrointestinal: Negative for nausea, vomiting and abdominal pain.  Genitourinary: Positive for menstrual problem. Negative for dysuria, urgency, hematuria and flank pain.  Musculoskeletal: Positive for myalgias, neck pain and neck stiffness.  Neurological: Positive for weakness and numbness. Negative for dizziness, light-headedness and headaches.    Past Medical History  Diagnosis Date  . Allergy   . Depression   . Chronic Sandoval disease   . Anxiety   . Hypertension    Past Surgical History  Procedure Laterality Date  . Cholecystectomy     Allergies  Allergen Reactions  . Hctz [Hydrochlorothiazide]     Feels bad on this medication/ has headache.   Current Outpatient Prescriptions  Medication Sig Dispense Refill  . levothyroxine (SYNTHROID, LEVOTHROID) 50 MCG tablet Take 1 tablet (50 mcg total) by mouth daily before breakfast. 30 tablet 5  . LORazepam (ATIVAN) 0.5 MG tablet Take 1 tablet (0.5 mg total) by mouth  daily as needed for anxiety. 30 tablet 1  . methocarbamol (ROBAXIN) 500 MG tablet Take 1-2 tablets (500-1,000 mg total) by mouth at bedtime as needed for muscle spasms. 40 tablet 0  . metoprolol succinate (TOPROL-XL) 100 MG 24 hr tablet One tablet daily.  Take with or immediately following a meal. 30 tablet 5  . predniSONE (DELTASONE) 20 MG tablet Three tablets daily x 2 days then two tablets daily x 4 days then one tablet daily x 4 days 18 tablet 0  . sertraline (ZOLOFT) 100 MG tablet Take 1 tablet (100 mg total) by mouth daily. 30 tablet 5  . tolterodine (DETROL LA) 4 MG 24 hr capsule Take 1 capsule (4 mg total) by mouth daily. 30 capsule 11  . HYDROcodone-acetaminophen (NORCO/VICODIN) 5-325 MG per tablet Take 1 tablet by mouth every 6 (six) hours as needed for moderate pain. 40 tablet 0  . ibuprofen (ADVIL,MOTRIN) 800 MG tablet Take 1 tablet (800 mg total) by mouth every 8 (eight) hours as needed. 40 tablet 0   No current facility-administered medications for this visit.       Objective:    BP 140/90 mmHg  Pulse 71  Temp(Src) 98.2 F (36.8 C) (Oral)  Resp 16  Ht 5' 7.25" (1.708 m)  Wt 262 lb 3.2 oz (118.933 kg)  BMI 40.77 kg/m2  SpO2 99%  LMP 08/09/2014 Physical Exam  Constitutional: She is oriented to person, place, and time. She appears well-developed and well-nourished. No distress.  HENT:  Head: Normocephalic and atraumatic.  Eyes: Conjunctivae  are normal. Pupils are equal, round, and reactive to light.  Neck: Normal range of motion. Neck supple. Muscular tenderness present. No spinous process tenderness present. No rigidity. Normal range of motion present.  Cardiovascular: Normal rate, regular rhythm and normal heart sounds.  Exam reveals no gallop and no friction rub.   No murmur heard. Pulmonary/Chest: Effort normal and breath sounds normal. She has no wheezes. She has no rales.  Musculoskeletal:       Right shoulder: She exhibits tenderness, pain and spasm. She exhibits  normal range of motion, no bony tenderness and normal strength.  Neurological: She is alert and oriented to person, place, and time. She has normal strength. No sensory deficit.  Skin: She is not diaphoretic.  Psychiatric: She has a normal mood and affect. Her behavior is normal.  Nursing note and vitals reviewed.  Results for orders placed or performed in visit on 10/01/14  POCT urine pregnancy  Result Value Ref Range   Preg Test, Ur Negative   POCT urinalysis dipstick  Result Value Ref Range   Color, UA yellow    Clarity, UA clear    Glucose, UA neg    Bilirubin, UA neg    Ketones, UA neg    Spec Grav, UA 1.025    Blood, UA trace-lysed    pH, UA 6.0    Protein, UA neg    Urobilinogen, UA 0.2    Nitrite, UA neg    Leukocytes, UA Negative   POCT UA - Microscopic Only  Result Value Ref Range   WBC, Ur, HPF, POC 0-2    RBC, urine, microscopic 0-4    Bacteria, U Microscopic neg    Mucus, UA neg    Epithelial cells, urine per micros 0-3    Crystals, Ur, HPF, POC calcium oxalate    Casts, Ur, LPF, POC neg    Yeast, UA neg    [facility] reading (PRIMARY) by  Dr. Tamala Julian.  CERVICAL SPINE FILMS:  NAD.      Assessment & Plan:   1. Neck pain on right side   2. Irregular menses   3. Hematuria   4. History of nephrolithiasis     1.  Neck pain R with radiculopathy:  Worsening; no improvement with Prednisone taper; continue Robaxin qhs; add Ibuprofen 800mg  tid; rx for hydrocodone to take qhs.  Refer for PT and ortho. 2.  Irregular menses: chronic; urine pregnancy negative. 3.  Hematuria: improved/resolved; secondary to nephrolithiasis. 4. Nephrolithiasis: improved; asymptomatic.   Meds ordered this encounter  Medications  . HYDROcodone-acetaminophen (NORCO/VICODIN) 5-325 MG per tablet    Sig: Take 1 tablet by mouth every 6 (six) hours as needed for moderate pain.    Dispense:  40 tablet    Refill:  0  . ibuprofen (ADVIL,MOTRIN) 800 MG tablet    Sig: Take 1 tablet (800 mg total)  by mouth every 8 (eight) hours as needed.    Dispense:  40 tablet    Refill:  0    No Follow-up on file.    Reginia Forts, M.D.  [facility] [address] [place], [place]  [address] [phone] phone [phone] fax

## 2014-12-10 ENCOUNTER — Ambulatory Visit: Payer: Self-pay

## 2014-12-10 ENCOUNTER — Ambulatory Visit (INDEPENDENT_AMBULATORY_CARE_PROVIDER_SITE_OTHER): Payer: BLUE CROSS/BLUE SHIELD

## 2014-12-10 ENCOUNTER — Ambulatory Visit (INDEPENDENT_AMBULATORY_CARE_PROVIDER_SITE_OTHER): Payer: BLUE CROSS/BLUE SHIELD | Admitting: Family Medicine

## 2014-12-10 VITALS — BP 154/88 | HR 90 | Temp 98.7°F | Resp 16 | Ht 67.0 in | Wt 271.0 lb

## 2014-12-10 DIAGNOSIS — E038 Other specified hypothyroidism: Secondary | ICD-10-CM

## 2014-12-10 DIAGNOSIS — R0789 Other chest pain: Secondary | ICD-10-CM

## 2014-12-10 DIAGNOSIS — M545 Low back pain, unspecified: Secondary | ICD-10-CM

## 2014-12-10 DIAGNOSIS — N939 Abnormal uterine and vaginal bleeding, unspecified: Secondary | ICD-10-CM

## 2014-12-10 DIAGNOSIS — G4700 Insomnia, unspecified: Secondary | ICD-10-CM

## 2014-12-10 DIAGNOSIS — F411 Generalized anxiety disorder: Secondary | ICD-10-CM

## 2014-12-10 DIAGNOSIS — E034 Atrophy of thyroid (acquired): Secondary | ICD-10-CM

## 2014-12-10 DIAGNOSIS — I10 Essential (primary) hypertension: Secondary | ICD-10-CM

## 2014-12-10 LAB — POCT UA - MICROSCOPIC ONLY
Casts, Ur, LPF, POC: NEGATIVE
YEAST UA: NEGATIVE

## 2014-12-10 LAB — POCT URINALYSIS DIPSTICK
BILIRUBIN UA: NEGATIVE
Glucose, UA: NEGATIVE
Ketones, UA: NEGATIVE
LEUKOCYTES UA: NEGATIVE
Nitrite, UA: NEGATIVE
Spec Grav, UA: 1.025
UROBILINOGEN UA: 0.2
pH, UA: 6

## 2014-12-10 LAB — POCT URINE PREGNANCY: Preg Test, Ur: NEGATIVE

## 2014-12-10 MED ORDER — AMLODIPINE BESYLATE 5 MG PO TABS: 5.0000 mg | ORAL_TABLET | Freq: Every day | ORAL | Status: DC

## 2014-12-10 MED ORDER — METHOCARBAMOL 500 MG PO TABS: 500.0000 mg | ORAL_TABLET | Freq: Every evening | ORAL | Status: DC | PRN

## 2014-12-10 MED ORDER — SERTRALINE HCL 100 MG PO TABS: 200.0000 mg | ORAL_TABLET | Freq: Every day | ORAL | Status: DC

## 2014-12-10 MED ORDER — MELOXICAM 15 MG PO TABS: 15.0000 mg | ORAL_TABLET | Freq: Every day | ORAL | Status: DC

## 2014-12-10 NOTE — Patient Instructions (Signed)

## 2014-12-10 NOTE — Progress Notes (Signed)
Subjective:    Patient ID: [name], female    DOB: 1976-09-29, 39 y.o.   MRN: [id number]  12/10/2014  Hypertension   HPI This 39 y.o. female presents for evaluation of hypertension.  Elevated for past several days.  Today at gynecology it was 159/99.  Taking Metoprolol ER 100mg  daily.  Started Spironalactone on Monday.  Was getting nervous about elevated blood pressure persistently.  +chest pain; hurts for past three days; no pain currently; intermittent; exertion does not seem to make worse.  Occurs at rest.  No associated SOB, nausea, diaphoresis.  Excessive stress lately.    2.  Irregular menses:  Has been bleeding for entire month of January; evaluated by gynecology today.   Having a lot of back pain; gynecology stating back pain not gynecological issue.  Chiropractor states not a musculoskeletal etiology.  Had pelvic US today and normal.  No ovarian cysts.  Uterus normal.  Scheduled for pelvic exam 01/25/15.  Three years overdue for pap smear.    3. Low back pain:  Worsens with cycle.  Feels just like back labor with son.  Has had constant pain since bleeding throughout January.  No pain with bending over; getting out of car does not make worse. Getting out of bed in the mornings is hard.  No pain traveling into legs.  No n/t/w.  Normal b/b dysfunction.  No saddle paresthesias.  Pain keeps awake.  Notices pain more at night.   No medications for pain; afraid to take Advil all the time; Advil 600mg  without improvement.   Last week, had pelvic pain with lower back pain; that is unusual.  No recent xrays of back.  When took muscle relaxer and pain medication for neck strain, back felt much better.  Not sleeping due to back pain.  4.  Anxiety and depression: increased to 200mg  Zoloft  daily for past month; not helping at all.  Not sleeping at all.  Husband is back at home; husband does not want anything to do with church.  Pain is keeping patient awake.  Using heating pad and hang out at  night.  Marriage is strained.  5.  Neck strain: sat in friend's chair massage with resolution of pain.  Pain resolved.     Review of Systems  Constitutional: Negative for fever, chills, diaphoresis and fatigue.  Respiratory: Negative for cough and shortness of breath.   Cardiovascular: Positive for chest pain. Negative for palpitations and leg swelling.  Gastrointestinal: Negative for nausea, vomiting, abdominal pain, diarrhea and abdominal distention.  Genitourinary: Positive for menstrual problem and pelvic pain. Negative for dysuria, frequency, hematuria, flank pain, vaginal bleeding and vaginal discharge.  Musculoskeletal: Positive for myalgias and back pain. Negative for gait problem, neck pain and neck stiffness.  Skin: Negative for rash.  Neurological: Negative for dizziness, tremors, syncope, speech difficulty, weakness, light-headedness, numbness and headaches.  Psychiatric/Behavioral: Positive for sleep disturbance and dysphoric mood. Negative for suicidal ideas and self-injury. The patient is nervous/anxious.     Past Medical History  Diagnosis Date  . Allergy   . Depression   . Chronic kidney disease   . Anxiety   . Hypertension    Past Surgical History  Procedure Laterality Date  . Cholecystectomy     Allergies  Allergen Reactions  . Hctz [Hydrochlorothiazide]     Feels bad on this medication/ has headache.   Current Outpatient Prescriptions  Medication Sig Dispense Refill  . levothyroxine (SYNTHROID, LEVOTHROID) 50 MCG tablet Take 1 tablet (  50 mcg total) by mouth daily before breakfast. 30 tablet 5  . LORazepam (ATIVAN) 0.5 MG tablet Take 1 tablet (0.5 mg total) by mouth daily as needed for anxiety. 30 tablet 1  . metoprolol succinate (TOPROL-XL) 100 MG 24 hr tablet One tablet daily.  Take with or immediately following a meal. 30 tablet 5  . sertraline (ZOLOFT) 100 MG tablet Take 2 tablets (200 mg total) by mouth daily. 60 tablet 5  . tolterodine (DETROL LA) 4 MG  24 hr capsule Take 1 capsule (4 mg total) by mouth daily. 30 capsule 11  . amLODipine (NORVASC) 5 MG tablet Take 1 tablet (5 mg total) by mouth daily. 30 tablet 5  . meloxicam (MOBIC) 15 MG tablet Take 1 tablet (15 mg total) by mouth daily. 30 tablet 0  . methocarbamol (ROBAXIN) 500 MG tablet Take 1-2 tablets (500-1,000 mg total) by mouth at bedtime as needed for muscle spasms. 60 tablet 2   No current facility-administered medications for this visit.       Objective:    BP 154/88 mmHg  Pulse 90  Temp(Src) 98.7 F (37.1 C) (Oral)  Resp 16  Ht 5\' 7"  (1.702 m)  Wt 271 lb (122.925 kg)  BMI 42.43 kg/m2  SpO2 99%  LMP 12/03/2014 Physical Exam  Constitutional: She is oriented to person, place, and time. She appears well-developed and well-nourished. No distress.  HENT:  Head: Normocephalic and atraumatic.  Right Ear: External ear normal.  Left Ear: External ear normal.  Nose: Nose normal.  Mouth/Throat: Oropharynx is clear and moist.  Eyes: Conjunctivae and EOM are normal. Pupils are equal, round, and reactive to light.  Neck: Normal range of motion. Neck supple. Carotid bruit is not present. No thyromegaly present.  Cardiovascular: Normal rate, regular rhythm, normal heart sounds and intact distal pulses.  Exam reveals no gallop and no friction rub.   No murmur heard. Pulmonary/Chest: Effort normal and breath sounds normal. She has no wheezes. She has no rales.  Abdominal: Soft. Bowel sounds are normal. She exhibits no distension and no mass. There is no tenderness. There is no rebound and no guarding.  Musculoskeletal:       Lumbar back: She exhibits tenderness. She exhibits normal range of motion, no swelling, no edema, no laceration, no pain, no spasm and normal pulse.       Back:  Lumbar spine:  Non-tender midline; + paraspinal TTP regions B.  Straight leg raises negative B; toe and heel walking intact; marching intact; motor 5/5 BLE.  Full ROM lumbar spine without limitation.    Lymphadenopathy:    She has no cervical adenopathy.  Neurological: She is alert and oriented to person, place, and time. No cranial nerve deficit.  Skin: Skin is warm and dry. No rash noted. She is not diaphoretic. No erythema. No pallor.  Psychiatric: She has a normal mood and affect. Her behavior is normal.  Tearful; anxious.   Results for orders placed or performed in visit on 12/10/14  POCT urinalysis dipstick  Result Value Ref Range   Color, UA yellow    Clarity, UA cloudy    Glucose, UA neg    Bilirubin, UA neg    Ketones, UA neg    Spec Grav, UA 1.025    Blood, UA trace    pH, UA 6.0    Protein, UA trace    Urobilinogen, UA 0.2    Nitrite, UA neg    Leukocytes, UA Negative   POCT UA -  Microscopic Only  Result Value Ref Range   WBC, Ur, HPF, POC 10-15    RBC, urine, microscopic 1-2    Bacteria, U Microscopic 1+    Mucus, UA trace    Epithelial cells, urine per micros 5-10    Crystals, Ur, HPF, POC calcium oxalate    Casts, Ur, LPF, POC neg    Yeast, UA neg   POCT urine pregnancy  Result Value Ref Range   Preg Test, Ur Negative    [facility] reading (PRIMARY) by  Dr. Tamala Julian.  LUMBAR FILMS:  NAD.  EKG: NSR; no ST changes.     Assessment & Plan:   1. Abnormal vaginal bleeding   2. Essential hypertension, benign   3. Other chest pain   4. Hypothyroidism due to acquired atrophy of thyroid   5. Bilateral low back pain without sciatica   6. Generalized anxiety disorder     1. Abnormal vaginal bleeding: New.  S/p pelvic US today; scheduled for appointment with gynecology in upcoming month.  Urine pregnancy negative. 2.  HTN: uncontrolled; continue Metoprolol ER 100mg  daily; add Amlodipine 5mg  daily; RTC 3 weeks. 3.  Chest pain: New. Atypical.  Stable EKG.  No exertional component; to ED for acute worsening; RTC three weeks for follow-up. 4.  Hypothyroidism: stable; obtain labs; continue current medications. 5.  Lower back pain without sciatica:  Persistent; rx for  Mobic and Robaxin provided; home exercise program provided; continue chiropractor; consider PT if no improvement in three weeks.  No radicular symptoms. 6.  Generalized anxiety disorder: worsening due to marital strain; continue Zoloft 200mg  daily; Robaxin qhs for insomnia and back pain to improve sleep. 6. Insomnia: uncontrolled; rx for Robaxin.   Meds ordered this encounter  Medications  . amLODipine (NORVASC) 5 MG tablet    Sig: Take 1 tablet (5 mg total) by mouth daily.    Dispense:  30 tablet    Refill:  5  . methocarbamol (ROBAXIN) 500 MG tablet    Sig: Take 1-2 tablets (500-1,000 mg total) by mouth at bedtime as needed for muscle spasms.    Dispense:  60 tablet    Refill:  2  . sertraline (ZOLOFT) 100 MG tablet    Sig: Take 2 tablets (200 mg total) by mouth daily.    Dispense:  60 tablet    Refill:  5  . meloxicam (MOBIC) 15 MG tablet    Sig: Take 1 tablet (15 mg total) by mouth daily.    Dispense:  30 tablet    Refill:  0    No Follow-up on file.    Dodie Parisi Elayne Guerin, M.D. [facility] [address] [place], [place]  [address] [phone] phone [phone] fax

## 2014-12-11 LAB — COMPREHENSIVE METABOLIC PANEL
ALT: 23 U/L (ref 0–35)
AST: 19 U/L (ref 0–37)
Albumin: 4.3 g/dL (ref 3.5–5.2)
Alkaline Phosphatase: 96 U/L (ref 39–117)
BILIRUBIN TOTAL: 0.6 mg/dL (ref 0.2–1.2)
BUN: 11 mg/dL (ref 6–23)
CO2: 28 mEq/L (ref 19–32)
Calcium: 9.7 mg/dL (ref 8.4–10.5)
Chloride: 101 mEq/L (ref 96–112)
Creat: 0.69 mg/dL (ref 0.50–1.10)
Glucose, Bld: 91 mg/dL (ref 70–99)
Potassium: 4.1 mEq/L (ref 3.5–5.3)
SODIUM: 137 meq/L (ref 135–145)
Total Protein: 7.5 g/dL (ref 6.0–8.3)

## 2014-12-11 LAB — CBC WITH DIFFERENTIAL/PLATELET
BASOS PCT: 0 % (ref 0–1)
Basophils Absolute: 0 10*3/uL (ref 0.0–0.1)
Eosinophils Absolute: 0.1 10*3/uL (ref 0.0–0.7)
Eosinophils Relative: 1 % (ref 0–5)
HEMATOCRIT: 41.7 % (ref 36.0–46.0)
HEMOGLOBIN: 13.9 g/dL (ref 12.0–15.0)
LYMPHS PCT: 25 % (ref 12–46)
Lymphs Abs: 3.6 10*3/uL (ref 0.7–4.0)
MCH: 28.5 pg (ref 26.0–34.0)
MCHC: 33.3 g/dL (ref 30.0–36.0)
MCV: 85.5 fL (ref 78.0–100.0)
MPV: 6.1 fL — ABNORMAL LOW (ref 8.6–12.4)
Monocytes Absolute: 0.9 10*3/uL (ref 0.1–1.0)
Monocytes Relative: 6 % (ref 3–12)
NEUTROS ABS: 9.7 10*3/uL — AB (ref 1.7–7.7)
NEUTROS PCT: 68 % (ref 43–77)
Platelets: 344 10*3/uL (ref 150–400)
RBC: 4.88 MIL/uL (ref 3.87–5.11)
RDW: 13.5 % (ref 11.5–15.5)
WBC: 14.2 10*3/uL — ABNORMAL HIGH (ref 4.0–10.5)

## 2014-12-28 ENCOUNTER — Ambulatory Visit: Payer: BC Managed Care – PPO | Admitting: Family Medicine

## 2015-02-03 ENCOUNTER — Ambulatory Visit (INDEPENDENT_AMBULATORY_CARE_PROVIDER_SITE_OTHER): Payer: BLUE CROSS/BLUE SHIELD | Admitting: Family Medicine

## 2015-02-03 ENCOUNTER — Encounter: Payer: Self-pay | Admitting: Family Medicine

## 2015-02-03 VITALS — BP 144/92 | HR 98 | Temp 98.9°F | Resp 16 | Ht 66.0 in | Wt 269.2 lb

## 2015-02-03 DIAGNOSIS — M545 Low back pain, unspecified: Secondary | ICD-10-CM

## 2015-02-03 DIAGNOSIS — R6889 Other general symptoms and signs: Secondary | ICD-10-CM

## 2015-02-03 DIAGNOSIS — F418 Other specified anxiety disorders: Secondary | ICD-10-CM

## 2015-02-03 DIAGNOSIS — N3941 Urge incontinence: Secondary | ICD-10-CM | POA: Diagnosis not present

## 2015-02-03 DIAGNOSIS — E038 Other specified hypothyroidism: Secondary | ICD-10-CM | POA: Diagnosis not present

## 2015-02-03 DIAGNOSIS — E034 Atrophy of thyroid (acquired): Secondary | ICD-10-CM | POA: Diagnosis not present

## 2015-02-03 DIAGNOSIS — I10 Essential (primary) hypertension: Secondary | ICD-10-CM | POA: Diagnosis not present

## 2015-02-03 DIAGNOSIS — F419 Anxiety disorder, unspecified: Secondary | ICD-10-CM

## 2015-02-03 DIAGNOSIS — F329 Major depressive disorder, single episode, unspecified: Secondary | ICD-10-CM

## 2015-02-03 LAB — CBC WITH DIFFERENTIAL/PLATELET
BASOS ABS: 0 10*3/uL (ref 0.0–0.1)
Basophils Relative: 0 % (ref 0–1)
EOS PCT: 1 % (ref 0–5)
Eosinophils Absolute: 0.1 10*3/uL (ref 0.0–0.7)
HCT: 38.3 % (ref 36.0–46.0)
HEMOGLOBIN: 13.1 g/dL (ref 12.0–15.0)
LYMPHS PCT: 11 % — AB (ref 12–46)
Lymphs Abs: 1 10*3/uL (ref 0.7–4.0)
MCH: 28.5 pg (ref 26.0–34.0)
MCHC: 34.2 g/dL (ref 30.0–36.0)
MCV: 83.4 fL (ref 78.0–100.0)
MONO ABS: 0.7 10*3/uL (ref 0.1–1.0)
MPV: 9.5 fL (ref 8.6–12.4)
Monocytes Relative: 7 % (ref 3–12)
Neutro Abs: 7.5 10*3/uL (ref 1.7–7.7)
Neutrophils Relative %: 81 % — ABNORMAL HIGH (ref 43–77)
Platelets: 270 10*3/uL (ref 150–400)
RBC: 4.59 MIL/uL (ref 3.87–5.11)
RDW: 13.6 % (ref 11.5–15.5)
WBC: 9.3 10*3/uL (ref 4.0–10.5)

## 2015-02-03 LAB — T4, FREE: FREE T4: 0.95 ng/dL (ref 0.80–1.80)

## 2015-02-03 LAB — POCT INFLUENZA A/B
Influenza A, POC: NEGATIVE
Influenza B, POC: NEGATIVE

## 2015-02-03 LAB — TSH: TSH: 2.389 u[IU]/mL (ref 0.350–4.500)

## 2015-02-03 MED ORDER — BUPROPION HCL ER (SR) 100 MG PO TB12: 100.0000 mg | ORAL_TABLET | Freq: Every day | ORAL | Status: DC

## 2015-02-03 MED ORDER — AMLODIPINE BESYLATE 10 MG PO TABS: 10.0000 mg | ORAL_TABLET | Freq: Every day | ORAL | Status: DC

## 2015-02-03 MED ORDER — HYDROCOD POLST-CHLORPHEN POLST 10-8 MG/5ML PO LQCR: 5.0000 mL | Freq: Two times a day (BID) | ORAL | Status: DC | PRN

## 2015-02-03 MED ORDER — SOLIFENACIN SUCCINATE 5 MG PO TABS: 5.0000 mg | ORAL_TABLET | Freq: Every day | ORAL | Status: DC

## 2015-02-03 NOTE — Patient Instructions (Signed)

## 2015-02-03 NOTE — Progress Notes (Signed)
Subjective:    Patient ID: [name], female    DOB: [birth year], 39 y.o.   MRN: [id number]  02/03/2015  Follow-up; Hypertension; Anxiety; LBP; insomnia; and Cough   HPI This 39 y.o. female presents for three month follow-up and evaluation of :  1. HTN: added Amlodipine 5mg  daily at last visit. Patient reports good compliance with medication, good tolerance to medication, and good symptom control.  Not checking BP at home. Denies HA/dizziness/focal weakness/paresthesias.  2.  Urinary incontinence: Detrol LA 4mg  daily is not working. Requesting different medication; still very reluctant to undergo urology evaluation. Suffers with ongoing leakage throughout the day.     3.  Anxiety and depression: does not think Zoloft is helping.  Too much is going on; overwhelmed; out of Lorazepam way before the last time seen; never helped anyway.  Lorazepam never made tired; could take several and did not help.  Realize that separation/marital strain is a huge thing; going on one year.  Went to talk with therapist/marital counselor; when went back on next visit, therapist did not recall pt.   Gave book on couple who are considering getting married.  Opened doors for really good conversation. Started doing the [employer] instead.  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 Husband called; Linus Salmons; he has left.  He left on Saturday while driving back from [place]; did not tell anyone; then came home the next day.  Had a good talk with close friends; going to work it out; the following day, called later about picking up car and left again.  Has been crying since Saturday.  Cries at bedtime.  So angry; wants to give up.  Intimacy; was devastated on Monday.  No counseling on own.    4.  HA, cough: onset three days ago.  +ST onset last night.  +excessive fatigue.  No fever; +sweats.  +chills.  Body aches.  No rhinorrhea.  +nasal congestion.  Bad cough.  Has worsened.  +nausea; no vomiting.  No diarrhea.  No flu  vaccine.  Feels horrible.  5. Lower back pain: no better since last visit. Not taking medication; does not have time to see specialist or to go to physical therapy. Review of Systems  Constitutional: Positive for fever, chills, diaphoresis and fatigue.  HENT: Positive for congestion, rhinorrhea and sore throat. Negative for ear pain and sinus pressure.   Eyes: Negative for visual disturbance.  Respiratory: Positive for cough. Negative for shortness of breath.   Cardiovascular: Negative for chest pain, palpitations and leg swelling.  Gastrointestinal: Positive for nausea. Negative for vomiting, abdominal pain, diarrhea and constipation.  Endocrine: Negative for cold intolerance, heat intolerance, polydipsia, polyphagia and polyuria.  Genitourinary: Negative for dysuria, urgency and genital sores.  Musculoskeletal: Positive for back pain.  Neurological: Negative for dizziness, tremors, seizures, syncope, facial asymmetry, speech difficulty, weakness, light-headedness, numbness and headaches.  Psychiatric/Behavioral: Positive for sleep disturbance, dysphoric mood and decreased concentration. Negative for suicidal ideas and self-injury. The patient is nervous/anxious.     Past Medical History  Diagnosis Date  . Allergy   . Depression   . Chronic kidney disease   . Anxiety   . Hypertension    Past Surgical History  Procedure Laterality Date  . Cholecystectomy     Allergies  Allergen Reactions  . Hctz [Hydrochlorothiazide]     Feels bad on this medication/ has headache.        Objective:    BP 144/92 mmHg  Pulse 98  Temp(Src) 98.9 F (  37.2 C) (Oral)  Resp 16  Ht 5\' 6"  (1.676 m)  Wt 269 lb 3.2 oz (122.108 kg)  BMI 43.47 kg/m2  SpO2 96%  LMP 01/17/2015 Physical Exam  Constitutional: She is oriented to person, place, and time. She appears well-developed and well-nourished. No distress.  HENT:  Head: Normocephalic and atraumatic.  Right Ear: External ear normal.  Left Ear:  External ear normal.  Nose: Nose normal.  Mouth/Throat: Posterior oropharyngeal erythema present. No oropharyngeal exudate.  Eyes: Conjunctivae and EOM are normal. Pupils are equal, round, and reactive to light.  Neck: Normal range of motion. Neck supple. Carotid bruit is not present. No thyromegaly present.  Cardiovascular: Normal rate, regular rhythm, normal heart sounds and intact distal pulses.  Exam reveals no gallop and no friction rub.   No murmur heard. Pulmonary/Chest: Effort normal and breath sounds normal. She has no wheezes. She has no rales.  Abdominal: Soft. Bowel sounds are normal. She exhibits no distension and no mass. There is no tenderness. There is no rebound and no guarding.  Lymphadenopathy:    She has no cervical adenopathy.  Neurological: She is alert and oriented to person, place, and time. No cranial nerve deficit.  Skin: Skin is warm and dry. No rash noted. She is not diaphoretic. No erythema. No pallor.  Psychiatric: She has a normal mood and affect. Her behavior is normal.  Tearful.        Assessment & Plan:   1. Flu-like symptoms   2. Hypothyroidism due to acquired atrophy of thyroid   3. Essential hypertension, benign   4. Anxiety and depression   5. Bilateral low back pain without sciatica   6. Urge incontinence of urine     1. Influenza: New.  Rx for Tussionex provided; pt declined rx for Tamiflu; low risk for complications of influenza; supportive care with rest, Tylenol; RTC for SOB or acute worsening. 2.  Hypothyroidism: stable; obtain labs; continue current medications. 3.  HTN: improved but remains elevated; increase Amlodipine to 10mg  daily. 4.  Anxiety and depression: worsening due to ongoing marital strain; add Wellbutrin SR 100mg  every morning; continue Zoloft 200mg  daily for next month and then decrease to 150mg  daily.  Counseling provided; highly recommend personal psychotherapy but pt reluctant. Contracted safety. 5.  B lower back pain:  persistent; continue home stretches, heat, Meloxicam.  Consider PT once school year ends. 6.  Urge incontinence: uncontrolled with Detrol LA 4mg  daily; switch to Vesicare; recommend using coupon. If too expensive, let office know.   Meds ordered this encounter  Medications  . amLODipine (NORVASC) 10 MG tablet    Sig: Take 1 tablet (10 mg total) by mouth daily.    Dispense:  30 tablet    Refill:  11  . solifenacin (VESICARE) 5 MG tablet    Sig: Take 1 tablet (5 mg total) by mouth daily.    Dispense:  30 tablet    Refill:  5  . buPROPion (WELLBUTRIN SR) 100 MG 12 hr tablet    Sig: Take 1 tablet (100 mg total) by mouth daily.    Dispense:  30 tablet    Refill:  5  . chlorpheniramine-HYDROcodone (TUSSIONEX) 10-8 MG/5ML LQCR    Sig: Take 5 mLs by mouth every 12 (twelve) hours as needed for cough.    Dispense:  180 mL    Refill:  0    Return in about 2 months (around 04/05/2015) for recheck.     Albeiro Trompeter Elayne Guerin, M.D. [facility] [address] [place], [place]  [address] [phone] phone [phone] fax

## 2015-02-08 ENCOUNTER — Telehealth: Payer: Self-pay | Admitting: Radiology

## 2015-02-08 NOTE — Telephone Encounter (Signed)
Advised pt to rtc. Pt understood.

## 2015-02-08 NOTE — Telephone Encounter (Signed)
Pt was dx with flu. Didn't get tamiflu filled. Her fever has come back, sore throat has gotten worse. Please advise.

## 2015-02-08 NOTE — Telephone Encounter (Signed)
Please advise patient to RTC.

## 2015-02-24 ENCOUNTER — Other Ambulatory Visit: Payer: Self-pay | Admitting: Family Medicine

## 2015-02-25 ENCOUNTER — Other Ambulatory Visit: Payer: Self-pay | Admitting: Family Medicine

## 2015-03-13 ENCOUNTER — Telehealth: Payer: Self-pay | Admitting: Family Medicine

## 2015-03-13 NOTE — Telephone Encounter (Signed)
lmom to call us to get information on her appt her appt was changed from 04/12/15 to 04/23/15 at 3:00

## 2015-04-12 ENCOUNTER — Ambulatory Visit: Payer: Self-pay | Admitting: Family Medicine

## 2015-04-15 ENCOUNTER — Ambulatory Visit (INDEPENDENT_AMBULATORY_CARE_PROVIDER_SITE_OTHER): Payer: BLUE CROSS/BLUE SHIELD | Admitting: Family Medicine

## 2015-04-15 VITALS — BP 128/80 | HR 95 | Temp 98.7°F | Resp 17 | Ht 66.5 in | Wt 263.2 lb

## 2015-04-15 DIAGNOSIS — I10 Essential (primary) hypertension: Secondary | ICD-10-CM

## 2015-04-15 DIAGNOSIS — N3941 Urge incontinence: Secondary | ICD-10-CM | POA: Diagnosis not present

## 2015-04-15 DIAGNOSIS — F419 Anxiety disorder, unspecified: Secondary | ICD-10-CM

## 2015-04-15 DIAGNOSIS — F329 Major depressive disorder, single episode, unspecified: Secondary | ICD-10-CM

## 2015-04-15 DIAGNOSIS — E669 Obesity, unspecified: Secondary | ICD-10-CM

## 2015-04-15 DIAGNOSIS — F32A Depression, unspecified: Secondary | ICD-10-CM | POA: Insufficient documentation

## 2015-04-15 DIAGNOSIS — F418 Other specified anxiety disorders: Secondary | ICD-10-CM

## 2015-04-15 MED ORDER — PHENTERMINE HCL 15 MG PO CAPS: 15.0000 mg | ORAL_CAPSULE | ORAL | Status: DC

## 2015-04-15 MED ORDER — SOLIFENACIN SUCCINATE 5 MG PO TABS: 5.0000 mg | ORAL_TABLET | Freq: Every day | ORAL | Status: DC

## 2015-04-15 NOTE — Patient Instructions (Signed)
[email or web address]

## 2015-04-15 NOTE — Progress Notes (Signed)
Subjective:    Patient ID: [name], female    DOB: 04-Jan-1976, 39 y.o.   MRN: [id number]  04/15/2015  Follow-up   HPI This 39 y.o. female presents for two month follow-up:  1. Anxiety and depression: husband is still gone; son admitted to hospital for two weeks for multiple blood clots in [place]; admitted [facility] and then [facility].  [facility] does not have pediatric hematologist.  No test results back yet; not sure if clotting disorder; not sure if due to excessive growth; aorta with congenital malformation; [facility] states that son does have aorta with large clot and has collateral veins.   Sleeps all the time.  Son got sick during the last few weeks of school.  Son is 22 years old.  Elementary school with poor enrollment.  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  Only two of every class now; offered pt a part-time.  Cried from 7:00am yesterday until now. Has been vomiting.  Too much.  Cannot work part-time; has no financial help.  Son is Geophysical data processor by mother being Building control surveyor.  Last few years has cut down from three classrooms to two classrooms per grade.  Management changes made at last visit include adding Wellbutrin which causes increased appetite.  Taking Zoloft daily; cannot get a hold on anything; son almost died; almost lost mind.  [place] cannot play sports.  Did not sleep last night; work was to call back.  Employed with [employer] for ten years.  No recent counseling; Parents aware.  Daughter won't leave pt.   Denies SI but admits to feeling very overwhelmed and not knowing the point of existence.  Strong Christian faith; suicide is a sin.  2.Obesity: gaining weight; needs something positive in life; went to bariatric clinic which helped lose weight.  Took Phentermine.  Cannot motivate self to exercise.  Has friend who can walk with; can walk in circle.  [place] [address]  Now that off for the summer, plans to use FitBit and prepare meals at home; very determined  to lose weight.  Really needs help jump starting weight loss.  3.  HTN: Patient reports good compliance with medication, good tolerance to medication, and good symptom control.  Denies HA/chest pain/palpitations/SOB/leg swelling.  4.  Urge incontinence: never started Vesicare after last visit; pharmacy has continued to fill Detrol LA 71m daily.  Denies dysuria, hematuria.  5. Sore throat/gland pain on R: onset two weeks ago; no fever/chills/sweats; no headache; no ear pain; no true sore throat; gland is just tender on R anterior neck. No teeth pain. No cough.   Review of Systems  Constitutional: Negative for fever, chills, diaphoresis and fatigue.  HENT: Positive for sore throat. Negative for congestion, drooling, ear discharge, ear pain, facial swelling, nosebleeds, postnasal drip, rhinorrhea, sinus pressure, sneezing, tinnitus, trouble swallowing and voice change.   Eyes: Negative for visual disturbance.  Respiratory: Negative for cough, shortness of breath, wheezing and stridor.   Cardiovascular: Negative for chest pain, palpitations and leg swelling.  Gastrointestinal: Negative for nausea, vomiting, abdominal pain, diarrhea and constipation.  Endocrine: Negative for cold intolerance, heat intolerance, polydipsia, polyphagia and polyuria.  Genitourinary: Positive for urgency. Negative for dysuria, frequency and hematuria.  Neurological: Negative for dizziness, tremors, seizures, syncope, facial asymmetry, speech difficulty, weakness, light-headedness, numbness and headaches.  Psychiatric/Behavioral: Positive for sleep disturbance and dysphoric mood. Negative for suicidal ideas and self-injury. The patient is nervous/anxious.     Past Medical History  Diagnosis Date  . Allergy   . Depression   . Chronic kidney disease   . Anxiety   . Hypertension    Past Surgical History  Procedure Laterality Date  . Cholecystectomy     Allergies  Allergen Reactions  . Sulfur Nausea Only  .  Hctz [Hydrochlorothiazide]     Feels bad on this medication/ has headache.   History   Social History  . Marital Status: Married    Spouse Name: N/A  . Number of Children: 2  . Years of Education: N/A   Occupational History  . 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 Smoking status: Never Smoker   . Smokeless tobacco: Not on file  . Alcohol Use: No  . Drug Use: No  . Sexual Activity: Yes   Other Topics Concern  . Not on file   Social History Narrative   Marital status: married; separated from husband in 03/2014.        Children: two (son 48 yo and daughter 89 yo)      Lives: with two children.      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 Diabetes Mother   . Heart disease Mother     CAD with stenting  . Hypertension Mother   . Hyperlipidemia Mother   . Hypothyroidism Mother   . Heart disease Father   . Heart disease Maternal Grandmother   . Stroke Maternal Grandmother   . Heart disease Paternal Grandmother   . Heart disease Paternal Grandfather         Objective:    BP 128/80 mmHg  Pulse 95  Temp(Src) 98.7 F (37.1 C) (Oral)  Resp 17  Ht 5' 6.5" (1.689 m)  Wt 263 lb 3.2 oz (119.387 kg)  BMI 41.85 kg/m2  SpO2 98%  LMP 04/08/2015 (LMP Unknown) Physical Exam  Constitutional: She is oriented to person, place, and time. She appears well-developed and well-nourished. No distress.  obese  HENT:  Head: Normocephalic and atraumatic.  Right Ear: External ear normal.  Left Ear: External ear normal.  Nose: Nose normal.  Mouth/Throat: Oropharynx is clear and moist.  Eyes: Conjunctivae and EOM are normal. Pupils are equal, round, and reactive to light.  Neck: Normal range of motion and full passive range of motion without pain. Neck supple. No JVD present. Carotid bruit is not  present. No thyromegaly present.  +R anterior cervical LAD less than 1 cm diameter.  Cardiovascular: Normal rate, regular rhythm and normal heart sounds.  Exam reveals no gallop and no friction rub.   No murmur heard. Pulmonary/Chest: Effort normal and breath sounds normal. She has no wheezes. She has no rales.  Abdominal: Soft. Bowel sounds are normal. She exhibits no distension and no mass. There is no tenderness. There is no rebound and no guarding.  Musculoskeletal:       Right shoulder: Normal.       Left shoulder: Normal.       Cervical back: Normal.  Lymphadenopathy:    She has cervical adenopathy.  Neurological: She is alert and oriented to person, place, and time. She has normal reflexes. No cranial nerve deficit. She exhibits normal muscle tone. Coordination normal.  Skin: Skin is  warm and dry. No rash noted. She is not diaphoretic. No erythema. No pallor.  Psychiatric: She has a normal mood and affect. Her behavior is normal. Judgment and thought content normal.  Tearful.  Nursing note and vitals reviewed.  Results for orders placed or performed in visit on 02/03/15  TSH  Result Value Ref Range   TSH 2.389 0.350 - 4.500 uIU/mL  T4, free  Result Value Ref Range   Free T4 0.95 0.80 - 1.80 ng/dL  CBC with Differential/Platelet  Result Value Ref Range   WBC 9.3 4.0 - 10.5 K/uL   RBC 4.59 3.87 - 5.11 MIL/uL   Hemoglobin 13.1 12.0 - 15.0 g/dL   HCT 38.3 36.0 - 46.0 %   MCV 83.4 78.0 - 100.0 fL   MCH 28.5 26.0 - 34.0 pg   MCHC 34.2 30.0 - 36.0 g/dL   RDW 13.6 11.5 - 15.5 %   Platelets 270 150 - 400 K/uL   MPV 9.5 8.6 - 12.4 fL   Neutrophils Relative % 81 (H) 43 - 77 %   Neutro Abs 7.5 1.7 - 7.7 K/uL   Lymphocytes Relative 11 (L) 12 - 46 %   Lymphs Abs 1.0 0.7 - 4.0 K/uL   Monocytes Relative 7 3 - 12 %   Monocytes Absolute 0.7 0.1 - 1.0 K/uL   Eosinophils Relative 1 0 - 5 %   Eosinophils Absolute 0.1 0.0 - 0.7 K/uL   Basophils Relative 0 0 - 1 %   Basophils Absolute  0.0 0.0 - 0.1 K/uL   Smear Review Criteria for review not met   POCT Influenza A/B  Result Value Ref Range   Influenza A, POC Negative    Influenza B, POC Negative        Assessment & Plan:   1. Essential hypertension   2. Obesity   3. Anxiety and depression   4. Urge incontinence    1. HTN: controlled; continue current medications. 2.  Obesity: persistent; agreeable to three month trial of Phentermine; rx provided; RTC one month; check BP daily. 3.  Anxiety and depression: worsening due to recent hospitalization of 38 year old son; pt did not tolerate Wellbutrin; continue Zoloft 2100m daily; pt declined referral for psychotherapy; highly recommend daily exercise; contracted safety; good family support from parents.   4.  Urge incontinence: uncontrolled; rx for Vesicare 557mdaily provided to replace Detrol LA 25m86m  Meds ordered this encounter  Medications  . phentermine 15 MG capsule    Sig: Take 1 capsule (15 mg total) by mouth every morning.    Dispense:  30 capsule    Refill:  0  . solifenacin (VESICARE) 5 MG tablet    Sig: Take 1 tablet (5 mg total) by mouth daily.    Dispense:  30 tablet    Refill:  5    No Follow-up on file.    Mckynzie Liwanag MarElayne Guerin.D. [facility] [place]ade[place]stown[place]  [phone]

## 2015-04-23 ENCOUNTER — Ambulatory Visit: Payer: BLUE CROSS/BLUE SHIELD | Admitting: Family Medicine

## 2015-05-07 ENCOUNTER — Ambulatory Visit (INDEPENDENT_AMBULATORY_CARE_PROVIDER_SITE_OTHER): Payer: BLUE CROSS/BLUE SHIELD | Admitting: Family Medicine

## 2015-05-07 VITALS — BP 110/84 | HR 90 | Temp 97.8°F | Resp 18 | Ht 66.5 in | Wt 264.0 lb

## 2015-05-07 DIAGNOSIS — F418 Other specified anxiety disorders: Secondary | ICD-10-CM

## 2015-05-07 DIAGNOSIS — F32A Depression, unspecified: Secondary | ICD-10-CM

## 2015-05-07 DIAGNOSIS — E669 Obesity, unspecified: Secondary | ICD-10-CM | POA: Diagnosis not present

## 2015-05-07 DIAGNOSIS — I10 Essential (primary) hypertension: Secondary | ICD-10-CM

## 2015-05-07 DIAGNOSIS — N3941 Urge incontinence: Secondary | ICD-10-CM

## 2015-05-07 DIAGNOSIS — F419 Anxiety disorder, unspecified: Principal | ICD-10-CM

## 2015-05-07 DIAGNOSIS — F329 Major depressive disorder, single episode, unspecified: Secondary | ICD-10-CM

## 2015-05-07 MED ORDER — PHENTERMINE HCL 37.5 MG PO TABS: 37.5000 mg | ORAL_TABLET | Freq: Every day | ORAL | Status: DC

## 2015-05-07 NOTE — Patient Instructions (Signed)
1. Go to [email or web address] to find a coupon for copay

## 2015-05-07 NOTE — Progress Notes (Signed)
Subjective:    Patient ID: [name], female    DOB: Jun 06, 1976, 39 y.o.   MRN: [id number]  05/07/2015  Follow-up   HPI This 39 y.o. female presents for follow-up evaluation:    1.  Obesity:  Started on Phentermine 29m daily at last visit three weeks ago.  Weight unchanged.  Started with one tablet daily of Phentermine; increased to two tablets daily this week. Started menses this week; usually gains 5-7 pounds with menses.  Weighed this morning but did not weigh upon return from the beach.  No walking yet but doing lots of projects around the house. Painting and stripping down stuff; cleaned out garage.  Did walk at the beach.    2.  Depression and anxiety:  Patient reports good compliance with medication, good tolerance to medication, and good symptom control.  Also cleaning out classroom at school.  Feeling well this summer; really organizing; labeling things.  Having fun.  Laundry room is done.  Taking today off and spending with daughter.  Son is better; testing returned; no blood clotting disorder.  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  Still on medication; has appointnment in two weeks; now on super high dose.  Son is working lBiomedical scientist  Can play soccer and baseball.  Did not have to have daughter tested since son's testing is negative.  No change with husband.    3.  HTN:  Blood pressure at home has been great.  Checking BP every other day.  Patient reports good compliance with medication, good tolerance to medication, and good symptom control.    4. Urinary incontinence urge:  Started on Vesicare 560mdaily at last visit three weeks ago.  Medication was too expensive; no coupon.   Review of Systems  Constitutional: Negative for fever, chills, diaphoresis and fatigue.  Eyes: Negative for visual disturbance.  Respiratory: Negative for cough and shortness of breath.   Cardiovascular: Negative for chest pain, palpitations and leg swelling.  Gastrointestinal: Negative for nausea, vomiting,  abdominal pain, diarrhea and constipation.  Endocrine: Negative for cold intolerance, heat intolerance, polydipsia, polyphagia and polyuria.  Neurological: Negative for dizziness, tremors, seizures, syncope, facial asymmetry, speech difficulty, weakness, light-headedness, numbness and headaches.  Psychiatric/Behavioral: Positive for dysphoric mood. Negative for suicidal ideas, sleep disturbance and self-injury. Agitation: famh. The patient is nervous/anxious.     Past Medical History  Diagnosis Date  . Allergy   . Depression   . Chronic kidney disease   . Anxiety   . Hypertension    Past Surgical History  Procedure Laterality Date  . Cholecystectomy     Allergies  Allergen Reactions  . Sulfur Nausea Only  . Hctz [Hydrochlorothiazide]     Feels bad on this medication/ has headache.   Social History   Social History  . Marital Status: Married    Spouse Name: N/A  . Number of Children: 2  . Years of Education: N/A   Occupational History  . 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 Smoking status: Never Smoker   . Smokeless tobacco: Not on file  . Alcohol Use: No  . Drug Use: No  . Sexual Activity: Yes   Other Topics Concern  . Not on file   Social History Narrative   Marital status: married; separated from husband in 03/2014.        Children: two (son 1312o and daughter 5 97o)      Lives: with two children.  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     Objective:    BP 110/84 mmHg  Pulse 90  Temp(Src) 97.8 F (36.6 C) (Oral)  Resp 18  Ht 5' 6.5" (1.689 m)  Wt 264 lb (119.75 kg)  BMI 41.98 kg/m2  SpO2 98%  LMP 05/06/2015 Physical Exam  Constitutional: She is oriented to person, place, and time. She appears well-developed and well-nourished. No distress.  HENT:  Head: Normocephalic and atraumatic.  Right Ear:  External ear normal.  Left Ear: External ear normal.  Nose: Nose normal.  Mouth/Throat: Oropharynx is clear and moist.  Eyes: Conjunctivae and EOM are normal. Pupils are equal, round, and reactive to light.  Neck: Normal range of motion. Neck supple. Carotid bruit is not present. No thyromegaly present.  Cardiovascular: Normal rate, regular rhythm, normal heart sounds and intact distal pulses.  Exam reveals no gallop and no friction rub.   No murmur heard. Pulmonary/Chest: Effort normal and breath sounds normal. She has no wheezes. She has no rales.  Abdominal: Soft. Bowel sounds are normal. She exhibits no distension and no mass. There is no tenderness. There is no rebound and no guarding.  Lymphadenopathy:    She has no cervical adenopathy.  Neurological: She is alert and oriented to person, place, and time. No cranial nerve deficit.  Skin: Skin is warm and dry. No rash noted. She is not diaphoretic. No erythema. No pallor.  Psychiatric: She has a normal mood and affect. Her behavior is normal.   Results for orders placed or performed in visit on 02/03/15  TSH  Result Value Ref Range   TSH 2.389 0.350 - 4.500 uIU/mL  T4, free  Result Value Ref Range   Free T4 0.95 0.80 - 1.80 ng/dL  CBC with Differential/Platelet  Result Value Ref Range   WBC 9.3 4.0 - 10.5 K/uL   RBC 4.59 3.87 - 5.11 MIL/uL   Hemoglobin 13.1 12.0 - 15.0 g/dL   HCT 38.3 36.0 - 46.0 %   MCV 83.4 78.0 - 100.0 fL   MCH 28.5 26.0 - 34.0 pg   MCHC 34.2 30.0 - 36.0 g/dL   RDW 13.6 11.5 - 15.5 %   Platelets 270 150 - 400 K/uL   MPV 9.5 8.6 - 12.4 fL   Neutrophils Relative % 81 (H) 43 - 77 %   Neutro Abs 7.5 1.7 - 7.7 K/uL   Lymphocytes Relative 11 (L) 12 - 46 %   Lymphs Abs 1.0 0.7 - 4.0 K/uL   Monocytes Relative 7 3 - 12 %   Monocytes Absolute 0.7 0.1 - 1.0 K/uL   Eosinophils Relative 1 0 - 5 %   Eosinophils Absolute 0.1 0.0 - 0.7 K/uL   Basophils Relative 0 0 - 1 %   Basophils Absolute 0.0 0.0 - 0.1 K/uL    Smear Review Criteria for review not met   POCT Influenza A/B  Result Value Ref Range   Influenza A, POC Negative    Influenza B, POC Negative        Assessment & Plan:   1. Anxiety and depression   2. Essential hypertension   3. Obesity   4. Urge incontinence     1. Anxiety and depression; improved; continue current dose of Zoloft.  Good family support. 2.  HTN: improved; no  change in medication at this time. 3.  Urge incontinence: uncontrolled; coupon for Vesicare recommended.  Consider referral to urology if no improvement with Vesicare; pt declined referral at this time. 4.  Obesity: persistent; tolerating Phentermine 107m daily; increase to 37.554mdaily; continue this dose for three months. Highly recommend daily exercise for 30-45 minutes, low caloric food choices; limiting calories to 1500 kcal per day.   Meds ordered this encounter  Medications  . phentermine (ADIPEX-P) 37.5 MG tablet    Sig: Take 1 tablet (37.5 mg total) by mouth daily before breakfast.    Dispense:  30 tablet    Refill:  2    No Follow-up on file.   Brissa Asante MaElayne GuerinM.D. [facility]th StreetrMorrisdaleNC  [phone] [phone]

## 2015-06-11 ENCOUNTER — Other Ambulatory Visit: Payer: Self-pay | Admitting: Family Medicine

## 2015-11-21 ENCOUNTER — Other Ambulatory Visit: Payer: Self-pay | Admitting: Family Medicine

## 2015-11-23 ENCOUNTER — Telehealth: Payer: Self-pay

## 2015-11-23 DIAGNOSIS — F411 Generalized anxiety disorder: Secondary | ICD-10-CM

## 2015-11-23 NOTE — Telephone Encounter (Signed)
PATIENT WOULD LIKE DR. Tamala Julian TO KNOW THAT SHE NEEDS A REFILL ON HER ZOLOFT. SHE IS COMPLETELY OUT. IT WILL BE A WHILE BEFORE SHE CAN GET AN APPOINTMENT AT [facility]. PATIENT WAS OFFERED DR. SMITH'S WALK-IN HOURS, BUT SHE SAID DR. Tamala Julian TOLD HER TO CALL HER IF SHE EVER HAD A PROBLEM WITH GETTING IT FILLED. BEST PHONE [phone]  [facility], [place]

## 2015-11-24 MED ORDER — SERTRALINE HCL 100 MG PO TABS: 200.0000 mg | ORAL_TABLET | Freq: Every day | ORAL | Status: DC

## 2015-11-24 NOTE — Telephone Encounter (Signed)
Refill done.  

## 2015-12-19 ENCOUNTER — Other Ambulatory Visit: Payer: Self-pay | Admitting: Family Medicine

## 2016-02-07 ENCOUNTER — Other Ambulatory Visit: Payer: Self-pay | Admitting: Family Medicine

## 2016-03-06 ENCOUNTER — Other Ambulatory Visit: Payer: Self-pay | Admitting: Family Medicine

## 2016-03-21 ENCOUNTER — Ambulatory Visit (INDEPENDENT_AMBULATORY_CARE_PROVIDER_SITE_OTHER): Payer: BLUE CROSS/BLUE SHIELD | Admitting: Family Medicine

## 2016-03-21 ENCOUNTER — Encounter: Payer: Self-pay | Admitting: Family Medicine

## 2016-03-21 VITALS — BP 132/92 | HR 88 | Temp 98.0°F | Resp 16 | Ht 66.25 in | Wt 275.6 lb

## 2016-03-21 DIAGNOSIS — E669 Obesity, unspecified: Secondary | ICD-10-CM

## 2016-03-21 DIAGNOSIS — N3941 Urge incontinence: Secondary | ICD-10-CM

## 2016-03-21 DIAGNOSIS — E038 Other specified hypothyroidism: Secondary | ICD-10-CM

## 2016-03-21 DIAGNOSIS — E034 Atrophy of thyroid (acquired): Secondary | ICD-10-CM | POA: Diagnosis not present

## 2016-03-21 DIAGNOSIS — F329 Major depressive disorder, single episode, unspecified: Secondary | ICD-10-CM

## 2016-03-21 DIAGNOSIS — F411 Generalized anxiety disorder: Secondary | ICD-10-CM

## 2016-03-21 DIAGNOSIS — F419 Anxiety disorder, unspecified: Principal | ICD-10-CM

## 2016-03-21 DIAGNOSIS — F418 Other specified anxiety disorders: Secondary | ICD-10-CM

## 2016-03-21 DIAGNOSIS — I10 Essential (primary) hypertension: Secondary | ICD-10-CM | POA: Diagnosis not present

## 2016-03-21 LAB — CBC WITH DIFFERENTIAL/PLATELET
Basophils Absolute: 0 cells/uL (ref 0–200)
Basophils Relative: 0 %
Eosinophils Absolute: 93 cells/uL (ref 15–500)
Eosinophils Relative: 1 %
HCT: 39.3 % (ref 35.0–45.0)
Hemoglobin: 13.1 g/dL (ref 11.7–15.5)
Lymphocytes Relative: 25 %
Lymphs Abs: 2325 cells/uL (ref 850–3900)
MCH: 28.5 pg (ref 27.0–33.0)
MCHC: 33.3 g/dL (ref 32.0–36.0)
MCV: 85.4 fL (ref 80.0–100.0)
MPV: 9.8 fL (ref 7.5–12.5)
Monocytes Absolute: 465 cells/uL (ref 200–950)
Monocytes Relative: 5 %
Neutro Abs: 6417 cells/uL (ref 1500–7800)
Neutrophils Relative %: 69 %
Platelets: 274 10*3/uL (ref 140–400)
RBC: 4.6 MIL/uL (ref 3.80–5.10)
RDW: 13.6 % (ref 11.0–15.0)
WBC: 9.3 10*3/uL (ref 3.8–10.8)

## 2016-03-21 LAB — COMPREHENSIVE METABOLIC PANEL
ALT: 33 U/L — ABNORMAL HIGH (ref 6–29)
AST: 27 U/L (ref 10–30)
Albumin: 4.1 g/dL (ref 3.6–5.1)
Alkaline Phosphatase: 97 U/L (ref 33–115)
BUN: 10 mg/dL (ref 7–25)
CO2: 25 mmol/L (ref 20–31)
Calcium: 9.2 mg/dL (ref 8.6–10.2)
Chloride: 99 mmol/L (ref 98–110)
Creat: 0.67 mg/dL (ref 0.50–1.10)
Glucose, Bld: 157 mg/dL — ABNORMAL HIGH (ref 65–99)
Potassium: 3.8 mmol/L (ref 3.5–5.3)
Sodium: 135 mmol/L (ref 135–146)
Total Bilirubin: 0.3 mg/dL (ref 0.2–1.2)
Total Protein: 6.9 g/dL (ref 6.1–8.1)

## 2016-03-21 LAB — LIPID PANEL
Cholesterol: 228 mg/dL — ABNORMAL HIGH (ref 125–200)
HDL: 59 mg/dL (ref >=46)
LDL CALC: 120 mg/dL (ref <130)
TRIGLYCERIDES: 244 mg/dL — AB (ref <150)
Total CHOL/HDL Ratio: 3.9 Ratio (ref <=5.0)
VLDL: 49 mg/dL — AB (ref <30)

## 2016-03-21 LAB — POC MICROSCOPIC URINALYSIS (UMFC)

## 2016-03-21 LAB — POCT URINALYSIS DIP (MANUAL ENTRY)
BILIRUBIN UA: NEGATIVE
BILIRUBIN UA: NEGATIVE
Glucose, UA: NEGATIVE
NITRITE UA: NEGATIVE
PH UA: 5.5
Protein Ur, POC: NEGATIVE
SPEC GRAV UA: 1.025
Urobilinogen, UA: 0.2

## 2016-03-21 MED ORDER — METOPROLOL SUCCINATE ER 100 MG PO TB24: ORAL_TABLET | ORAL | Status: DC

## 2016-03-21 MED ORDER — PHENTERMINE HCL 37.5 MG PO TABS: 37.5000 mg | ORAL_TABLET | Freq: Every day | ORAL | Status: DC

## 2016-03-21 MED ORDER — AMLODIPINE BESYLATE 10 MG PO TABS: 10.0000 mg | ORAL_TABLET | Freq: Every day | ORAL | Status: DC

## 2016-03-21 MED ORDER — SERTRALINE HCL 100 MG PO TABS: 200.0000 mg | ORAL_TABLET | Freq: Every day | ORAL | Status: DC

## 2016-03-21 NOTE — Patient Instructions (Signed)
     IF you received an x-ray today, you will receive an invoice from [facility]. Please contact [facility] at [phone] with questions or concerns regarding your invoice.   IF you received labwork today, you will receive an invoice from [facility]. Please contact [facility] at [phone] with questions or concerns regarding your invoice.   Our billing staff will not be able to assist you with questions regarding bills from these companies.  You will be contacted with the lab results as soon as they are available. The fastest way to get your results is to activate your My Chart account. Instructions are located on the last page of this paperwork. If you have not heard from us regarding the results in 2 weeks, please contact this office.      

## 2016-03-21 NOTE — Progress Notes (Signed)
Subjective:    Patient ID: [name], female    DOB: 1976/05/23, 40 y.o.   MRN: [id number]  03/21/2016  Medication Refill; and Depression; and pt haven't had BP meds x 1 week,   HPI This 41 y.o. female presents for ten month follow-up:  1.  Anxiety and depression: gained 20 pounds in the past ten months; lost 20 pounds; joined [employer]; going 4 days per week.  Son is 38 now so he keeps daughter; plans to go early every morning.  Grandfather died this year; had to help father.  Cllint has been gone over a year.  Has been gone a year.  Has been showing up at church with family.  Showing up for Sunday school.  Completely stopped talking to [employer].   Beliefs are very important to patient; son got into trouble. Ran out of Zoloft months ago.    2.  HTN: out of blood pressure medication.  Has been out of blood pressure medications for several weeks.  3.  Obesity: ready to start Phentermine.  4.  Overactive bladder:  Medication too expensive.   Review of Systems  Constitutional: Negative for fever, chills, diaphoresis and fatigue.  Eyes: Negative for visual disturbance.  Respiratory: Negative for cough and shortness of breath.   Cardiovascular: Negative for chest pain, palpitations and leg swelling.  Gastrointestinal: Negative for nausea, vomiting, abdominal pain, diarrhea and constipation.  Endocrine: Negative for cold intolerance, heat intolerance, polydipsia, polyphagia and polyuria.  Neurological: Negative for dizziness, tremors, seizures, syncope, facial asymmetry, speech difficulty, weakness, light-headedness, numbness and headaches.    Past Medical History  Diagnosis Date  . Allergy   . Depression   . Chronic kidney disease   . Anxiety   . Hypertension    Past Surgical History  Procedure Laterality Date  . Cholecystectomy     Allergies  Allergen Reactions  . Sulfur Nausea Only  . Hctz [Hydrochlorothiazide]     Feels bad on this medication/ has headache.     Social History   Social History  . Marital Status: Married    Spouse Name: N/A  . Number of Children: 2  . Years of Education: N/A   Occupational History  . 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 Smoking status: Never Smoker   . Smokeless tobacco: Not on file  . Alcohol Use: No  . Drug Use: No  . Sexual Activity: Yes   Other Topics Concern  . Not on file   Social History Narrative   Marital status: married; separated from husband in 03/2014.        Children: two (son 35 yo and daughter 62 yo)      Lives: with two children.      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 Diabetes Mother   . Heart disease Mother     CAD with stenting  . Hypertension Mother   . Hyperlipidemia Mother   . Hypothyroidism Mother   . Heart disease Father   . Heart disease Maternal Grandmother   . Stroke Maternal Grandmother   . Heart disease Paternal Grandmother   . Heart disease Paternal Grandfather        Objective:  BP 132/92 mmHg  Pulse 88  Temp(Src) 98 F (36.7 C) (Oral)  Resp 16  Ht 5' 6.25" (1.683 m)  Wt 275 lb 9.6 oz (125.011 kg)  BMI 44.13 kg/m2  SpO2 98%  LMP 02/24/2016 Physical Exam  Constitutional: She is oriented to person, place, and time. She appears well-developed and well-nourished. No distress.  HENT:  Head: Normocephalic and atraumatic.  Right Ear: External ear normal.  Left Ear: External ear normal.  Nose: Nose normal.  Mouth/Throat: Oropharynx is clear and moist.  Eyes: Conjunctivae and EOM are normal. Pupils are equal, round, and reactive to light.  Neck: Normal range of motion. Neck supple. Carotid bruit is not present. No thyromegaly present.  Cardiovascular: Normal rate, regular rhythm, normal heart sounds and intact distal pulses.  Exam reveals no gallop and  no friction rub.   No murmur heard. Pulmonary/Chest: Effort normal and breath sounds normal. She has no wheezes. She has no rales.  Abdominal: Soft. Bowel sounds are normal. She exhibits no distension and no mass. There is no tenderness. There is no rebound and no guarding.  Lymphadenopathy:    She has no cervical adenopathy.  Neurological: She is alert and oriented to person, place, and time. No cranial nerve deficit.  Skin: Skin is warm and dry. No rash noted. She is not diaphoretic. No erythema. No pallor.  Psychiatric: She has a normal mood and affect. Her behavior is normal.        Assessment & Plan:   1. Anxiety and depression   2. Urge incontinence   3. Essential hypertension   4. Obesity   5. Generalized anxiety disorder   6. Hypothyroidism due to acquired atrophy of thyroid     Orders Placed This Encounter  Procedures  . CBC with Differential/Platelet  . Comprehensive metabolic panel    Order Specific Question:  Has the patient fasted?    Answer:  Yes  . Hemoglobin A1c  . Lipid panel    Order Specific Question:  Has the patient fasted?    Answer:  Yes  . TSH  . T4, free  . POCT urinalysis dipstick  . POCT Microscopic Urinalysis ([facility])   Meds ordered this encounter  Medications  . sertraline (ZOLOFT) 100 MG tablet    Sig: Take 2 tablets (200 mg total) by mouth daily.    Dispense:  60 tablet    Refill:  5  . metoprolol succinate (TOPROL-XL) 100 MG 24 hr tablet    Sig: TAKE ONE TABLET BY MOUTH ONE TIME DAILY (TAKE WITH OR IMMEDIATELY FOLLOWING A MEAL)    Dispense:  30 tablet    Refill:  5  . phentermine (ADIPEX-P) 37.5 MG tablet    Sig: Take 1 tablet (37.5 mg total) by mouth daily before breakfast.    Dispense:  30 tablet    Refill:  2  . amLODipine (NORVASC) 10 MG tablet    Sig: Take 1 tablet (10 mg total) by mouth daily.    Dispense:  30 tablet    Refill:  5    Return in about 6 months (around 09/21/2016) for recheck.    Ahmari Duerson Elayne Guerin,  M.D. [facility] [address] [place], [place]  [address] [phone] phone [phone] fax

## 2016-03-22 LAB — TSH: TSH: 6.35 mIU/L — ABNORMAL HIGH

## 2016-03-22 LAB — HEMOGLOBIN A1C
Hgb A1c MFr Bld: 5.9 % — ABNORMAL HIGH (ref <5.7)
Mean Plasma Glucose: 123 mg/dL

## 2016-03-22 LAB — T4, FREE: Free T4: 0.9 ng/dL (ref 0.8–1.8)

## 2016-05-31 ENCOUNTER — Other Ambulatory Visit: Payer: Self-pay

## 2016-05-31 DIAGNOSIS — F411 Generalized anxiety disorder: Secondary | ICD-10-CM

## 2016-05-31 MED ORDER — SERTRALINE HCL 100 MG PO TABS: 200.0000 mg | ORAL_TABLET | Freq: Every day | ORAL | 0 refills | Status: DC

## 2016-08-06 ENCOUNTER — Other Ambulatory Visit: Payer: Self-pay | Admitting: Family Medicine

## 2016-08-25 ENCOUNTER — Other Ambulatory Visit: Payer: Self-pay | Admitting: Family Medicine

## 2016-09-19 ENCOUNTER — Ambulatory Visit: Payer: BLUE CROSS/BLUE SHIELD | Admitting: Family Medicine

## 2016-12-16 ENCOUNTER — Ambulatory Visit (INDEPENDENT_AMBULATORY_CARE_PROVIDER_SITE_OTHER): Payer: BC Managed Care – PPO | Admitting: Family Medicine

## 2016-12-16 VITALS — BP 124/90 | HR 105 | Temp 97.8°F | Resp 18 | Ht 66.0 in | Wt 275.0 lb

## 2016-12-16 DIAGNOSIS — Z114 Encounter for screening for human immunodeficiency virus [HIV]: Secondary | ICD-10-CM

## 2016-12-16 DIAGNOSIS — N3941 Urge incontinence: Secondary | ICD-10-CM

## 2016-12-16 DIAGNOSIS — F418 Other specified anxiety disorders: Secondary | ICD-10-CM | POA: Diagnosis not present

## 2016-12-16 DIAGNOSIS — I10 Essential (primary) hypertension: Secondary | ICD-10-CM | POA: Diagnosis not present

## 2016-12-16 DIAGNOSIS — Z0000 Encounter for general adult medical examination without abnormal findings: Secondary | ICD-10-CM | POA: Diagnosis not present

## 2016-12-16 DIAGNOSIS — Z111 Encounter for screening for respiratory tuberculosis: Secondary | ICD-10-CM

## 2016-12-16 DIAGNOSIS — Z13220 Encounter for screening for lipoid disorders: Secondary | ICD-10-CM

## 2016-12-16 DIAGNOSIS — E034 Atrophy of thyroid (acquired): Secondary | ICD-10-CM

## 2016-12-16 DIAGNOSIS — F419 Anxiety disorder, unspecified: Secondary | ICD-10-CM

## 2016-12-16 DIAGNOSIS — F329 Major depressive disorder, single episode, unspecified: Secondary | ICD-10-CM

## 2016-12-16 DIAGNOSIS — Z131 Encounter for screening for diabetes mellitus: Secondary | ICD-10-CM

## 2016-12-16 LAB — POCT URINALYSIS DIP (MANUAL ENTRY)
BILIRUBIN UA: NEGATIVE
Glucose, UA: NEGATIVE
Ketones, POC UA: NEGATIVE
LEUKOCYTES UA: NEGATIVE
NITRITE UA: NEGATIVE
PROTEIN UA: NEGATIVE
Spec Grav, UA: 1.025
UROBILINOGEN UA: 0.2
pH, UA: 6

## 2016-12-16 MED ORDER — LEVOTHYROXINE SODIUM 50 MCG PO TABS: ORAL_TABLET | ORAL | 3 refills | Status: DC

## 2016-12-16 MED ORDER — METOPROLOL SUCCINATE ER 50 MG PO TB24: ORAL_TABLET | ORAL | 1 refills | Status: DC

## 2016-12-16 MED ORDER — SERTRALINE HCL 100 MG PO TABS: 200.0000 mg | ORAL_TABLET | Freq: Every day | ORAL | 1 refills | Status: DC

## 2016-12-16 MED ORDER — AMLODIPINE BESYLATE 5 MG PO TABS: 5.0000 mg | ORAL_TABLET | Freq: Every day | ORAL | 1 refills | Status: DC

## 2016-12-16 NOTE — Progress Notes (Signed)
Subjective:    Patient ID: [name], female    DOB: 10/07/1976, 41 y.o.   MRN: [id number]  12/16/2016  Medication Refill (all 4 meds) and PPD Placement   HPI This 41 y.o. female presents for nine month follow-up for hypertension, anxiety/depression, PPD placement and CPE.   Last physical: years ago Pap smear:  04/30/2012; per gynecology; menses regular. Mammogram:  Overdue; per gyn  REFUSES TETANUS VACCINE AND INFLUENZA VACCINES.   Immunization History  Administered Date(s) Administered  . Td 02/27/1997  . Tdap 10/30/2004   BP Readings from Last 3 Encounters:  12/16/16 124/90  03/21/16 (!) 132/92  05/07/15 110/84   Wt Readings from Last 3 Encounters:  12/16/16 275 lb (124.7 kg)  03/21/16 275 lb 9.6 oz (125 kg)  05/07/15 264 lb (119.7 kg)    HTN: Patient reports POOR compliance with medication, good tolerance to medication, and good symptom control.    Anxiety and depression: husband moved home since last visit; all is going well. Children are struggling some.     Review of Systems  Constitutional: Negative for activity change, appetite change, chills, diaphoresis, fatigue, fever and unexpected weight change.  HENT: Negative for congestion, dental problem, drooling, ear discharge, ear pain, facial swelling, hearing loss, mouth sores, nosebleeds, postnasal drip, rhinorrhea, sinus pressure, sneezing, sore throat, tinnitus, trouble swallowing and voice change.   Eyes: Negative for photophobia, pain, discharge, redness, itching and visual disturbance.  Respiratory: Negative for apnea, cough, choking, chest tightness, shortness of breath, wheezing and stridor.   Cardiovascular: Negative for chest pain, palpitations and leg swelling.  Gastrointestinal: Negative for abdominal distention, abdominal pain, anal bleeding, blood in stool, constipation, diarrhea, nausea, rectal pain and vomiting.  Endocrine: Negative for cold intolerance, heat intolerance, polydipsia, polyphagia  and polyuria.  Genitourinary: Negative for decreased urine volume, difficulty urinating, dyspareunia, dysuria, enuresis, flank pain, frequency, genital sores, hematuria, menstrual problem, pelvic pain, urgency, vaginal bleeding, vaginal discharge and vaginal pain.  Musculoskeletal: Negative for arthralgias, back pain, gait problem, joint swelling, myalgias, neck pain and neck stiffness.  Skin: Negative for color change, pallor, rash and wound.  Allergic/Immunologic: Negative for environmental allergies, food allergies and immunocompromised state.  Neurological: Negative for dizziness, tremors, seizures, syncope, facial asymmetry, speech difficulty, weakness, light-headedness, numbness and headaches.  Hematological: Negative for adenopathy. Does not bruise/bleed easily.  Psychiatric/Behavioral: Positive for dysphoric mood. Negative for agitation, behavioral problems, confusion, decreased concentration, hallucinations, self-injury, sleep disturbance and suicidal ideas. The patient is nervous/anxious. The patient is not hyperactive.     Past Medical History:  Diagnosis Date  . Allergy   . Anxiety   . Chronic kidney disease   . Depression   . Hypertension    Past Surgical History:  Procedure Laterality Date  . CHOLECYSTECTOMY     Allergies  Allergen Reactions  . Sulfur Nausea Only  . Hctz [Hydrochlorothiazide]     Feels bad on this medication/ has headache.    Social History   Social History  . Marital status: Married    Spouse name: N/A  . Number of children: 2  . Years of education: N/A   Occupational History  . 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 Smoking status: Never Smoker  . Smokeless tobacco: Never Used  . Alcohol use No  . Drug use: No  . Sexual activity: Yes    Birth control/ protection: None   Other Topics Concern  . Not on file  Social History Narrative   Marital status: married      Children: two (son 42  yo and daughter 26 yo)      Lives: with two children, husband.      Employment: Pharmacist, hospital at [employer]      Tobacco: none      Alcohol: none      Drugs: none      Exercise: walking sporadically   Family History  Problem Relation Age of Onset  . Diabetes Mother   . Heart disease Mother     CAD with stenting  . Hypertension Mother   . Hyperlipidemia Mother   . Hypothyroidism Mother   . Heart disease Father   . Heart disease Maternal Grandmother   . Stroke Maternal Grandmother   . Heart disease Paternal Grandmother   . Heart disease Paternal Grandfather        Objective:    BP 124/90   Pulse (!) 105   Temp 97.8 F (36.6 C) (Oral)   Resp 18   Ht 5\' 6"  (1.676 m)   Wt 275 lb (124.7 kg)   LMP 10/15/2016 (Approximate)   SpO2 98%   BMI 44.39 kg/m  Physical Exam  Constitutional: She is oriented to person, place, and time. She appears well-developed and well-nourished. No distress.  HENT:  Head: Normocephalic and atraumatic.  Right Ear: External ear normal.  Left Ear: External ear normal.  Nose: Nose normal.  Mouth/Throat: Oropharynx is clear and moist.  Eyes: Conjunctivae and EOM are normal. Pupils are equal, round, and reactive to light.  Neck: Normal range of motion. Neck supple. Carotid bruit is not present. No thyromegaly present.  Cardiovascular: Normal rate, regular rhythm, normal heart sounds and intact distal pulses.  Exam reveals no gallop and no friction rub.   No murmur heard. Pulmonary/Chest: Effort normal and breath sounds normal. She has no wheezes. She has no rales.  Abdominal: Soft. Bowel sounds are normal. She exhibits no distension and no mass. There is no tenderness. There is no rebound and no guarding.  Musculoskeletal:       Right shoulder: Normal.       Left shoulder: Normal.       Cervical back: Normal.       Thoracic back: Normal.       Lumbar back: Normal.  Lymphadenopathy:    She has no cervical adenopathy.  Neurological: She  is alert and oriented to person, place, and time. No cranial nerve deficit.  Skin: Skin is warm and dry. No rash noted. She is not diaphoretic. No erythema. No pallor.  Psychiatric: She has a normal mood and affect. Her behavior is normal.   Depression screen [facility] 2/9 12/16/2016 03/21/2016 05/07/2015 04/15/2015 09/21/2014  Decreased Interest 0 0 0 0 0  Down, Depressed, Hopeless 0 0 0 0 0  PHQ - 2 Score 0 0 0 0 0        Assessment & Plan:   1. Routine physical examination   2. Anxiety and depression   3. Essential hypertension   4. Urge incontinence   5. Hypothyroidism due to acquired atrophy of thyroid   6. Screening, lipid   7. Screening for diabetes mellitus   8. Screening for HIV (human immunodeficiency virus)   9. Screening for tuberculosis    -anticipatory guidance provided --- exercise, weight loss, low-calorie food diet 1500 kcal. -recommend follow-up with gynecology for pap smear and mammo -pt refused TDAP and influenza vaccines. -obtain age appropriate screening labs  and chronic disease state management labs. -PPD placement; RTC 2-3 days for read -refills provided.   Orders Placed This Encounter  Procedures  . CBC with Differential/Platelet  . Comprehensive metabolic panel    Order Specific Question:   Has the patient fasted?    Answer:   Yes  . Lipid panel    Order Specific Question:   Has the patient fasted?    Answer:   Yes  . TSH  . HIV antibody  . Hemoglobin A1c  . POCT urinalysis dipstick  . TB Skin Test    Order Specific Question:   Has patient ever tested positive?    Answer:   No   Meds ordered this encounter  Medications  . levothyroxine (SYNTHROID, LEVOTHROID) 50 MCG tablet    Sig: TAKE ONE TABLET BY MOUTH DAILY BEFORE BREAKFAST    Dispense:  90 tablet    Refill:  3  . amLODipine (NORVASC) 5 MG tablet    Sig: Take 1 tablet (5 mg total) by mouth daily.    Dispense:  90 tablet    Refill:  1    REFIL REQUEST  . metoprolol succinate (TOPROL-XL) 50  MG 24 hr tablet    Sig: TAKE ONE TABLET BY MOUTH ONE TIME DAILY (TAKE WITH OR IMMEDIATELY FOLLOWING A MEAL)    Dispense:  90 tablet    Refill:  1  . sertraline (ZOLOFT) 100 MG tablet    Sig: Take 2 tablets (200 mg total) by mouth daily.    Dispense:  180 tablet    Refill:  1    Return in about 3 days (around 12/19/2016) for Tb skin test.   Norwood Levo, M.D. Primary Care at [facility] previously [facility] [address] [place], [place]  [address] [phone] phone [phone] fax

## 2016-12-16 NOTE — Progress Notes (Signed)

## 2016-12-16 NOTE — Patient Instructions (Addendum)
IF you received an x-ray today, you will receive an invoice from [facility]. Please contact [facility] at [phone] with questions or concerns regarding your invoice.   IF you received labwork today, you will receive an invoice from [facility]. Please contact [facility] at [phone] with questions or concerns regarding your invoice.   Our billing staff will not be able to assist you with questions regarding bills from these companies.  You will be contacted with the lab results as soon as they are available. The fastest way to get your results is to activate your My Chart account. Instructions are located on the last page of this paperwork. If you have not heard from [place] regarding the results in 2 weeks, please contact this office.      Preventive Care 40-64 Years, Female Preventive care refers to lifestyle choices and visits with your health care provider that can promote health and wellness. What does preventive care include?  A yearly physical exam. This is also called an annual well check.  Dental exams once or twice a year.  Routine eye exams. Ask your health care provider how often you should have your eyes checked.  Personal lifestyle choices, including:  Daily care of your teeth and gums.  Regular physical activity.  Eating a healthy diet.  Avoiding tobacco and drug use.  Limiting alcohol use.  Practicing safe sex.  Taking low-dose aspirin daily starting at age 55.  Taking vitamin and mineral supplements as recommended by your health care provider. What happens during an annual well check? The services and screenings done by your health care provider during your annual well check will depend on your age, overall health, lifestyle risk factors, and family history of disease. Counseling  Your health care provider may ask you questions about your:  Alcohol use.  Tobacco use.  Drug use.  Emotional well-being.  Home and relationship  well-being.  Sexual activity.  Eating habits.  Work and work Statistician.  Method of birth control.  Menstrual cycle.  Pregnancy history. Screening  You may have the following tests or measurements:  Height, weight, and BMI.  Blood pressure.  Lipid and cholesterol levels. These may be checked every 5 years, or more frequently if you are over 13 years old.  Skin check.  Lung cancer screening. You may have this screening every year starting at age 20 if you have a 30-pack-year history of smoking and currently smoke or have quit within the past 15 years.  Fecal occult blood test (FOBT) of the stool. You may have this test every year starting at age 41.  Flexible sigmoidoscopy or colonoscopy. You may have a sigmoidoscopy every 5 years or a colonoscopy every 10 years starting at age 79.  Hepatitis C blood test.  Hepatitis B blood test.  Sexually transmitted disease (STD) testing.  Diabetes screening. This is done by checking your blood sugar (glucose) after you have not eaten for a while (fasting). You may have this done every 1-3 years.  Mammogram. This may be done every 1-2 years. Talk to your health care provider about when you should start having regular mammograms. This may depend on whether you have a family history of breast cancer.  BRCA-related cancer screening. This may be done if you have a family history of breast, ovarian, tubal, or peritoneal cancers.  Pelvic exam and Pap test. This may be done every 3 years starting at age 24. Starting at age 33, this may be done every 5 years  if you have a Pap test in combination with an HPV test.  Bone density scan. This is done to screen for osteoporosis. You may have this scan if you are at high risk for osteoporosis. Discuss your test results, treatment options, and if necessary, the need for more tests with your health care provider. Vaccines  Your health care provider may recommend certain vaccines, such  as:  Influenza vaccine. This is recommended every year.  Tetanus, diphtheria, and acellular pertussis (Tdap, Td) vaccine. You may need a Td booster every 10 years.  Varicella vaccine. You may need this if you have not been vaccinated.  Zoster vaccine. You may need this after age 75.  Measles, mumps, and rubella (MMR) vaccine. You may need at least one dose of MMR if you were born in 1957 or later. You may also need a second dose.  Pneumococcal 13-valent conjugate (PCV13) vaccine. You may need this if you have certain conditions and were not previously vaccinated.  Pneumococcal polysaccharide (PPSV23) vaccine. You may need one or two doses if you smoke cigarettes or if you have certain conditions.  Meningococcal vaccine. You may need this if you have certain conditions.  Hepatitis A vaccine. You may need this if you have certain conditions or if you travel or work in places where you may be exposed to hepatitis A.  Hepatitis B vaccine. You may need this if you have certain conditions or if you travel or work in places where you may be exposed to hepatitis B.  Haemophilus influenzae type b (Hib) vaccine. You may need this if you have certain conditions. Talk to your health care provider about which screenings and vaccines you need and how often you need them. This information is not intended to replace advice given to you by your health care provider. Make sure you discuss any questions you have with your health care provider. Document Released: 11/12/2015 Document Revised: 07/05/2016 Document Reviewed: 08/17/2015 Elsevier Interactive Patient Education  2017 [employer].

## 2016-12-17 LAB — CBC WITH DIFFERENTIAL/PLATELET
BASOS: 0 %
Basophils Absolute: 0 10*3/uL (ref 0.0–0.2)
EOS (ABSOLUTE): 0.1 10*3/uL (ref 0.0–0.4)
EOS: 1 %
HEMATOCRIT: 39.9 % (ref 34.0–46.6)
HEMOGLOBIN: 13 g/dL (ref 11.1–15.9)
IMMATURE GRANULOCYTES: 0 %
Immature Grans (Abs): 0 10*3/uL (ref 0.0–0.1)
Lymphocytes Absolute: 2.4 10*3/uL (ref 0.7–3.1)
Lymphs: 23 %
MCH: 27.8 pg (ref 26.6–33.0)
MCHC: 32.6 g/dL (ref 31.5–35.7)
MCV: 85 fL (ref 79–97)
Monocytes Absolute: 0.6 10*3/uL (ref 0.1–0.9)
Monocytes: 6 %
NEUTROS PCT: 70 %
Neutrophils Absolute: 7.1 10*3/uL — ABNORMAL HIGH (ref 1.4–7.0)
Platelets: 320 10*3/uL (ref 150–379)
RBC: 4.67 x10E6/uL (ref 3.77–5.28)
RDW: 14 % (ref 12.3–15.4)
WBC: 10.3 10*3/uL (ref 3.4–10.8)

## 2016-12-17 LAB — COMPREHENSIVE METABOLIC PANEL
ALBUMIN: 4.2 g/dL (ref 3.5–5.5)
ALT: 34 IU/L — ABNORMAL HIGH (ref 0–32)
AST: 30 IU/L (ref 0–40)
Albumin/Globulin Ratio: 1.4 (ref 1.2–2.2)
Alkaline Phosphatase: 120 IU/L — ABNORMAL HIGH (ref 39–117)
BUN / CREAT RATIO: 8 — AB (ref 9–23)
BUN: 6 mg/dL (ref 6–24)
Bilirubin Total: 0.4 mg/dL (ref 0.0–1.2)
CALCIUM: 9.7 mg/dL (ref 8.7–10.2)
CO2: 24 mmol/L (ref 18–29)
CREATININE: 0.79 mg/dL (ref 0.57–1.00)
Chloride: 98 mmol/L (ref 96–106)
GFR, EST AFRICAN AMERICAN: 106 mL/min/{1.73_m2} (ref >59)
GFR, EST NON AFRICAN AMERICAN: 92 mL/min/{1.73_m2} (ref >59)
GLOBULIN, TOTAL: 3 g/dL (ref 1.5–4.5)
Glucose: 91 mg/dL (ref 65–99)
Potassium: 4.2 mmol/L (ref 3.5–5.2)
SODIUM: 140 mmol/L (ref 134–144)
TOTAL PROTEIN: 7.2 g/dL (ref 6.0–8.5)

## 2016-12-17 LAB — LIPID PANEL
CHOL/HDL RATIO: 4.1 ratio (ref 0.0–4.4)
Cholesterol, Total: 231 mg/dL — ABNORMAL HIGH (ref 100–199)
HDL: 57 mg/dL (ref >39)
LDL CALC: 143 mg/dL — AB (ref 0–99)
TRIGLYCERIDES: 154 mg/dL — AB (ref 0–149)
VLDL Cholesterol Cal: 31 mg/dL (ref 5–40)

## 2016-12-17 LAB — HIV ANTIBODY (ROUTINE TESTING W REFLEX): HIV Screen 4th Generation wRfx: NONREACTIVE

## 2016-12-17 LAB — TSH: TSH: 5.32 u[IU]/mL — ABNORMAL HIGH (ref 0.450–4.500)

## 2016-12-17 LAB — HEMOGLOBIN A1C
Est. average glucose Bld gHb Est-mCnc: 114 mg/dL
HEMOGLOBIN A1C: 5.6 % (ref 4.8–5.6)

## 2016-12-18 ENCOUNTER — Ambulatory Visit (INDEPENDENT_AMBULATORY_CARE_PROVIDER_SITE_OTHER): Payer: BC Managed Care – PPO | Admitting: Physician Assistant

## 2016-12-18 DIAGNOSIS — Z111 Encounter for screening for respiratory tuberculosis: Secondary | ICD-10-CM

## 2016-12-18 LAB — TB SKIN TEST
Induration: 0 mm
TB Skin Test: NEGATIVE

## 2016-12-27 NOTE — Progress Notes (Signed)
Tb reading only

## 2017-01-05 ENCOUNTER — Encounter: Payer: Self-pay | Admitting: Family Medicine

## 2017-01-05 DIAGNOSIS — Z111 Encounter for screening for respiratory tuberculosis: Secondary | ICD-10-CM

## 2017-04-10 ENCOUNTER — Telehealth: Payer: Self-pay | Admitting: Family Medicine

## 2017-04-10 NOTE — Telephone Encounter (Signed)
LMOM TO CALL AND RESCHEDULE APPOINTMENT THAT SHE HAD WITH SMITH ON 05-30-2017 SHE WILL BE OUT OF THE OFFICE THAT DAY

## 2017-05-30 ENCOUNTER — Ambulatory Visit: Payer: BC Managed Care – PPO | Admitting: Family Medicine

## 2018-01-19 ENCOUNTER — Other Ambulatory Visit: Payer: Self-pay | Admitting: Family Medicine

## 2018-01-19 DIAGNOSIS — F329 Major depressive disorder, single episode, unspecified: Secondary | ICD-10-CM

## 2018-01-19 DIAGNOSIS — F419 Anxiety disorder, unspecified: Principal | ICD-10-CM

## 2018-01-21 NOTE — Telephone Encounter (Signed)
Zoloft refill request  LOV 12/16/16 with Dr. Tamala Julian  [facility] [phone] in [place], [place]  Dr.

## 2018-02-16 ENCOUNTER — Encounter: Payer: Self-pay | Admitting: Family Medicine

## 2018-02-16 ENCOUNTER — Other Ambulatory Visit: Payer: Self-pay

## 2018-02-16 ENCOUNTER — Ambulatory Visit: Payer: BC Managed Care – PPO | Admitting: Family Medicine

## 2018-02-16 VITALS — BP 132/82 | HR 97 | Temp 98.0°F | Resp 16 | Wt 288.0 lb

## 2018-02-16 DIAGNOSIS — K219 Gastro-esophageal reflux disease without esophagitis: Secondary | ICD-10-CM

## 2018-02-16 DIAGNOSIS — R590 Localized enlarged lymph nodes: Secondary | ICD-10-CM

## 2018-02-16 DIAGNOSIS — F32A Depression, unspecified: Secondary | ICD-10-CM

## 2018-02-16 DIAGNOSIS — F419 Anxiety disorder, unspecified: Secondary | ICD-10-CM

## 2018-02-16 DIAGNOSIS — Z131 Encounter for screening for diabetes mellitus: Secondary | ICD-10-CM | POA: Diagnosis not present

## 2018-02-16 DIAGNOSIS — N3941 Urge incontinence: Secondary | ICD-10-CM

## 2018-02-16 DIAGNOSIS — E034 Atrophy of thyroid (acquired): Secondary | ICD-10-CM

## 2018-02-16 DIAGNOSIS — I10 Essential (primary) hypertension: Secondary | ICD-10-CM

## 2018-02-16 DIAGNOSIS — R221 Localized swelling, mass and lump, neck: Secondary | ICD-10-CM | POA: Diagnosis not present

## 2018-02-16 DIAGNOSIS — F329 Major depressive disorder, single episode, unspecified: Secondary | ICD-10-CM

## 2018-02-16 MED ORDER — METOPROLOL SUCCINATE ER 50 MG PO TB24: ORAL_TABLET | ORAL | 1 refills | Status: DC

## 2018-02-16 MED ORDER — SERTRALINE HCL 100 MG PO TABS: 200.0000 mg | ORAL_TABLET | Freq: Every day | ORAL | 1 refills | Status: DC

## 2018-02-16 MED ORDER — AMLODIPINE BESYLATE 5 MG PO TABS: 5.0000 mg | ORAL_TABLET | Freq: Every day | ORAL | 1 refills | Status: DC

## 2018-02-16 MED ORDER — LEVOTHYROXINE SODIUM 50 MCG PO TABS: ORAL_TABLET | ORAL | 3 refills | Status: DC

## 2018-02-16 MED ORDER — LIRAGLUTIDE -WEIGHT MANAGEMENT 18 MG/3ML ~~LOC~~ SOPN: 0.6000 mg | PEN_INJECTOR | Freq: Every day | SUBCUTANEOUS | 1 refills | Status: DC

## 2018-02-16 NOTE — Patient Instructions (Addendum)
  Recommend weight loss, exercise for 30-60 minutes five days per week; recommend 1200 kcal restriction per day with a minimum of 60 grams of protein per day.  Eat 3 meals per day. Do not skip meals. Consider having a protein shake as a meal replacement to aid with eliminating meal skipping. Look for products with <220 calories, <7 gm sugar, and 20-30 gm protein.  Eat breakfast within 2 hours of getting up.   Make  your plate non-starchy vegetables,  protein, and  carbohydrates at lunch and dinner.   Aim for at least 64 oz. of calorie-free beverages daily (water, Crystal Light, diet green tea, etc.). Eliminate any sugary beverages such as regular soda, sweet tea, or fruit juice.   Pay attention to hunger and fullness cues.  Stop eating once you feel satisfied; don't wait until you feel full, stuffed, or sick from eating.  Choose lean meats and low fat/fat free dairy products.  Choose foods high in fiber such as fruits, vegetables, and whole grains (brown rice, whole wheat pasta, whole wheat bread, etc.).  Limit foods with added sugar to <7 gm per serving.  Always eat in the kitchen/dining room.  Never eat in the bedroom or in front of the TV.     PREMIER PROTEIN SHAKES OR SLIM FAST HIGH PROTEIN SHAKES.   IF you received an x-ray today, you will receive an invoice from [facility]. Please contact [facility] at [phone] with questions or concerns regarding your invoice.   IF you received labwork today, you will receive an invoice from [facility]. Please contact [facility] at [phone] with questions or concerns regarding your invoice.   Our billing staff will not be able to assist you with questions regarding bills from these companies.  You will be contacted with the lab results as soon as they are available. The fastest way to get your results is to activate your My Chart account. Instructions are located on the last page of this paperwork. If you have not  heard from [place] regarding the results in 2 weeks, please contact this office.

## 2018-02-16 NOTE — Progress Notes (Signed)
Subjective:    Patient ID: [name], female    DOB: May 08, 1976, 42 y.o.   MRN: [id number]  02/16/2018  Chronic Conditions and Mass (pt states she has had this lump behind her right ear x 3 weeks )    HPI This 42 y.o. female presents for fourteen month follow-up of chronic diseases including hypertension, anxiety/depression, overactive bladder with urge incontinence, obesity.    Edison Nasuti in [place] for Spring Break.  Graduates this spring.  [name] is 17 years old.  Husband is good.  Renovations on house.   Had an episode this year; severe headache with nausea, blood pressure super high; vision changes; beginning of school; no recurrence.  Took three days off.  History of migraines.  This headache was different. Felt like a panic attack.    Out of all medications.   Went to walk in clinic about knot along R jaw.  Feels smaller but still present.  Augmentin prescribed; caused nausea; took all of it.  Face was really swollen.  No teeth pain.  Treated on 01/28/2018.  No increased swelling when ate. 50% improved.   Bladder incontinence is an issue; interferes with intercourse and exercise. Obesity: afraid to take Phentermine; afraid to take due to risk of elevation of blood pressure. Walking; also doing exercise tapes.  Friend at work has lost a lot of weight.    BP Readings from Last 3 Encounters:  04/16/18 (!) 106/59  03/18/18 118/86  03/12/18 119/87   Wt Readings from Last 3 Encounters:  04/15/18 280 lb (127 kg)  04/09/18 282 lb (127.9 kg)  03/18/18 280 lb (127 kg)   Immunization History  Administered Date(s) Administered  . PPD Test 01/05/2017  . Td 02/27/1997  . Tdap 10/30/2004    Review of Systems  Constitutional: Negative for activity change, appetite change, chills, diaphoresis, fatigue, fever and unexpected weight change.  HENT: Positive for facial swelling. Negative for congestion, dental problem, drooling, ear discharge, ear pain, hearing loss, mouth sores,  nosebleeds, postnasal drip, rhinorrhea, sinus pressure, sinus pain, sneezing, sore throat, tinnitus, trouble swallowing and voice change.   Eyes: Negative for visual disturbance.  Respiratory: Negative for cough and shortness of breath.   Cardiovascular: Negative for chest pain, palpitations and leg swelling.  Gastrointestinal: Negative for abdominal pain, constipation, diarrhea, nausea and vomiting.  Endocrine: Negative for cold intolerance, heat intolerance, polydipsia, polyphagia and polyuria.  Genitourinary: Positive for frequency and urgency. Negative for decreased urine volume, difficulty urinating, dyspareunia, dysuria, enuresis, flank pain, genital sores, hematuria, menstrual problem, pelvic pain, vaginal bleeding, vaginal discharge and vaginal pain.  Musculoskeletal: Negative for neck pain and neck stiffness.  Skin: Negative for rash.  Neurological: Negative for dizziness, tremors, seizures, syncope, facial asymmetry, speech difficulty, weakness, light-headedness, numbness and headaches.  Hematological: Positive for adenopathy. Does not bruise/bleed easily.  Psychiatric/Behavioral: Positive for dysphoric mood. Negative for self-injury, sleep disturbance and suicidal ideas. The patient is nervous/anxious.     Past Medical History:  Diagnosis Date  . Allergy   . Anxiety   . Depression   . Family history of adverse reaction to anesthesia    MOM-HARD TO WAKE UP  . Headache    MIGRAINES  . History of kidney stones    H/O  . History of methicillin resistant staphylococcus aureus (MRSA) 2009  . Hypertension    Past Surgical History:  Procedure Laterality Date  . CHOLECYSTECTOMY    . PAROTIDECTOMY Right 04/15/2018   Procedure: PAROTIDECTOMY;  Surgeon: Carloyn Manner, MD;  Location: [facility] ORS;  Service: ENT;  Laterality: Right;   Allergies  Allergen Reactions  . Sulfur Nausea Only  . Hctz [Hydrochlorothiazide]     Feels bad on this medication/ has headache.   No current  outpatient medications on file prior to visit.   No current facility-administered medications on file prior to visit.    Social History   Socioeconomic History  . Marital status: Married    Spouse name: Not on file  . Number of children: 2  . Years of education: Not on file  . Highest education level: Not on file  Occupational History  . Occupation: Pharmacist, hospital    Comment: Dispensing optician in [place]  . Financial resource strain: Not on file  . Food insecurity:    Worry: Not on file    Inability: Not on file  . Transportation needs:    Medical: Not on file    Non-medical: Not on file  Tobacco Use  . Smoking status: Never Smoker  . Smokeless tobacco: Never Used  Substance and Sexual Activity  . Alcohol use: No  . Drug use: No  . Sexual activity: Yes    Birth control/protection: None  Lifestyle  . Physical activity:    Days per week: Not on file    Minutes per session: Not on file  . Stress: Not on file  Relationships  . Social connections:    Talks on phone: Not on file    Gets together: Not on file    Attends religious service: Not on file    Active member of club or organization: Not on file    Attends meetings of clubs or organizations: Not on file    Relationship status: Not on file  . Intimate partner violence:    Fear of current or ex partner: Not on file    Emotionally abused: Not on file    Physically abused: Not on file    Forced sexual activity: Not on file  Other Topics Concern  . Not on file  Social History Narrative   Marital status: married      Children: two (son 75 yo and daughter 73 yo)      Lives: with two children, husband.      Employment: Pharmacist, hospital at [employer]      Tobacco: none      Alcohol: none      Drugs: none      Exercise: walking sporadically   Family History  Problem Relation Age of Onset  . Diabetes Mother   . Heart disease Mother        CAD with stenting  . Hypertension Mother     . Hyperlipidemia Mother   . Hypothyroidism Mother   . Heart disease Father   . Heart disease Maternal Grandmother   . Stroke Maternal Grandmother   . Heart disease Paternal Grandmother   . Heart disease Paternal Grandfather        Objective:    BP 132/82   Pulse 97   Temp 98 F (36.7 C) (Oral)   Resp 16   Wt 288 lb (130.6 kg)   LMP 02/01/2018 (Approximate)   SpO2 97%   BMI 46.48 kg/m  Physical Exam  Constitutional: She is oriented to person, place, and time. She appears well-developed and well-nourished. No distress.  HENT:  Head: Normocephalic and atraumatic.  Right Ear: Tympanic membrane, external ear and ear canal normal.  Left Ear: Tympanic membrane, external ear and ear  canal normal.  Nose: Nose normal.  Mouth/Throat: Uvula is midline, oropharynx is clear and moist and mucous membranes are normal. No oropharyngeal exudate, posterior oropharyngeal edema or posterior oropharyngeal erythema. No tonsillar exudate.  Eyes: Pupils are equal, round, and reactive to light. Conjunctivae and EOM are normal.  Neck: Normal range of motion. Neck supple. Carotid bruit is not present. No thyromegaly present.  Cardiovascular: Normal rate, regular rhythm, normal heart sounds and intact distal pulses. Exam reveals no gallop and no friction rub.  No murmur heard. Pulmonary/Chest: Effort normal and breath sounds normal. She has no wheezes. She has no rales.  Abdominal: Soft. Bowel sounds are normal. She exhibits no distension and no mass. There is no tenderness. There is no rebound and no guarding.  Lymphadenopathy:       Head (right side): Submandibular adenopathy present.    She has no cervical adenopathy.  Neurological: She is alert and oriented to person, place, and time. No cranial nerve deficit.  Skin: Skin is warm and dry. No rash noted. She is not diaphoretic. No erythema. No pallor.  Psychiatric: She has a normal mood and affect. Her behavior is normal.   No results  found. Depression screen [facility] 2/9 03/18/2018 02/16/2018 12/16/2016 03/21/2016 05/07/2015  Decreased Interest 0 0 0 0 0  Down, Depressed, Hopeless 0 0 0 0 0  PHQ - 2 Score 0 0 0 0 0   Fall Risk  03/18/2018 02/16/2018 12/16/2016 03/21/2016 09/21/2014  Falls in the past year? No No No No No  Risk for fall due to : - - - - History of fall(s)        Assessment & Plan:   1. LAD (lymphadenopathy), submandibular   2. Localized swelling, mass or lump of neck   3. Essential hypertension   4. Anxiety and depression   5. Gastroesophageal reflux disease without esophagitis   6. Screening for diabetes mellitus   7. Hypothyroidism due to acquired atrophy of thyroid   8. Morbid obesity ([place])   9. Urge incontinence     New onset right submandibular lymphadenopathy: Status post Augmentin therapy with 50% improvement.  Persistent swelling for 1 month.  Obtain CT of neck.  Refer to ENT.  Hypertension, anxiety and depression, hypothyroidism all uncontrolled due to noncompliance with medical follow-up.  Obtain labs for chronic disease management.  Restart patient on metoprolol, Synthroid, Zoloft therapies.  Encourage regular medical follow-up for ongoing management of chronic disease states.    Morbid obesity: Worsening.  Initiate Trulicity therapy.  Recommend weight loss, exercise for 30-60 minutes five days per week; recommend 1200 kcal restriction per day with a minimum of 60 grams of protein per day.  Eat 3 meals per day. Do not skip meals. Consider having a protein shake as a meal replacement to aid with eliminating meal skipping. Look for products with <220 calories, <7 gm sugar, and 20-30 gm protein.  Eat breakfast within 2 hours of getting up.   Make  your plate non-starchy vegetables,  protein, and  carbohydrates at lunch and dinner.   Aim for at least 64 oz. of calorie-free beverages daily (water, Crystal Light, diet green tea, etc.). Eliminate any sugary beverages such as regular soda, sweet tea,  or fruit juice.   Pay attention to hunger and fullness cues.  Stop eating once you feel satisfied; don't wait until you feel full, stuffed, or sick from eating.  Choose lean meats and low fat/fat free dairy products.  Choose foods high in fiber such  as fruits, vegetables, and whole grains (brown rice, whole wheat pasta, whole wheat bread, etc.).  Limit foods with added sugar to <7 gm per serving.  Always eat in the kitchen/dining room.  Never eat in the bedroom or in front of the TV.    Urge incontinence: Uncontrolled.  Weight loss will help with incontinence issues.  Previous Vesicare yet cost prohibitive.  Previous Detrol yet not effective.    Orders Placed This Encounter  Procedures  . CT Soft Tissue Neck W Contrast    Standing Status:   Future    Number of Occurrences:   1    Standing Expiration Date:   05/19/2019    Order Specific Question:   If indicated for the ordered procedure, I authorize the administration of contrast media per Radiology protocol    Answer:   Yes    Order Specific Question:   Is patient pregnant?    Answer:   No    Order Specific Question:   Preferred imaging location?    Answer:   [facility]    Order Specific Question:   Radiology Contrast Protocol - do NOT remove file path    Answer:   [email or web address]  . CBC with Differential/Platelet  . Comprehensive metabolic panel    Order Specific Question:   Has the patient fasted?    Answer:   No  . Lipid panel    Order Specific Question:   Has the patient fasted?    Answer:   No  . TSH  . Hemoglobin A1c  . Urinalysis, dipstick only  . T4, free  . Ambulatory referral to ENT    Referral Priority:   Urgent    Referral Type:   Consultation    Referral Reason:   Specialty Services Required    Requested Specialty:   Otolaryngology    Number of Visits Requested:   1  . POCT urine pregnancy  . EKG 12-Lead   Meds ordered this encounter  Medications  . DISCONTD:  Liraglutide -Weight Management (SAXENDA) 18 MG/3ML SOPN    Sig: Inject 0.6 mg into the skin daily.    Dispense:  3 mL    Refill:  1  . amLODipine (NORVASC) 5 MG tablet    Sig: Take 1 tablet (5 mg total) by mouth daily.    Dispense:  90 tablet    Refill:  1  . levothyroxine (SYNTHROID, LEVOTHROID) 50 MCG tablet    Sig: TAKE ONE TABLET BY MOUTH DAILY BEFORE BREAKFAST    Dispense:  90 tablet    Refill:  3  . metoprolol succinate (TOPROL-XL) 50 MG 24 hr tablet    Sig: TAKE ONE TABLET BY MOUTH ONE TIME DAILY (TAKE WITH OR IMMEDIATELY FOLLOWING A MEAL)    Dispense:  90 tablet    Refill:  1  . sertraline (ZOLOFT) 100 MG tablet    Sig: Take 2 tablets (200 mg total) by mouth daily.    Dispense:  180 tablet    Refill:  1    Return in about 1 month (around 03/16/2018) for recheck.   Ruta Capece Elayne Guerin, M.D. Primary Care at [facility] previously [facility] [address] [place], [place]  [phone] phone [phone] fax

## 2018-02-17 LAB — URINALYSIS, DIPSTICK ONLY
Bilirubin, UA: NEGATIVE
Glucose, UA: NEGATIVE
Ketones, UA: NEGATIVE
Leukocytes, UA: NEGATIVE
Nitrite, UA: NEGATIVE
Protein, UA: NEGATIVE
RBC, UA: NEGATIVE
Specific Gravity, UA: 1.016 (ref 1.005–1.030)
UUROB: 0.2 mg/dL (ref 0.2–1.0)
pH, UA: 8 — ABNORMAL HIGH (ref 5.0–7.5)

## 2018-02-18 ENCOUNTER — Telehealth: Payer: Self-pay

## 2018-02-18 LAB — COMPREHENSIVE METABOLIC PANEL
ALBUMIN: 4.1 g/dL (ref 3.5–5.5)
ALK PHOS: 117 IU/L (ref 39–117)
ALT: 62 IU/L — ABNORMAL HIGH (ref 0–32)
AST: 64 IU/L — ABNORMAL HIGH (ref 0–40)
Albumin/Globulin Ratio: 1.4 (ref 1.2–2.2)
BILIRUBIN TOTAL: 0.3 mg/dL (ref 0.0–1.2)
BUN / CREAT RATIO: 11 (ref 9–23)
BUN: 7 mg/dL (ref 6–24)
CHLORIDE: 100 mmol/L (ref 96–106)
CO2: 23 mmol/L (ref 20–29)
Calcium: 9.5 mg/dL (ref 8.7–10.2)
Creatinine, Ser: 0.63 mg/dL (ref 0.57–1.00)
GFR calc Af Amer: 126 mL/min/{1.73_m2} (ref >59)
GFR calc non Af Amer: 109 mL/min/{1.73_m2} (ref >59)
GLOBULIN, TOTAL: 3 g/dL (ref 1.5–4.5)
Glucose: 88 mg/dL (ref 65–99)
Potassium: 4.3 mmol/L (ref 3.5–5.2)
SODIUM: 137 mmol/L (ref 134–144)
Total Protein: 7.1 g/dL (ref 6.0–8.5)

## 2018-02-18 LAB — LIPID PANEL
CHOLESTEROL TOTAL: 260 mg/dL — AB (ref 100–199)
Chol/HDL Ratio: 4.6 ratio — ABNORMAL HIGH (ref 0.0–4.4)
HDL: 56 mg/dL (ref >39)
LDL CALC: 166 mg/dL — AB (ref 0–99)
Triglycerides: 192 mg/dL — ABNORMAL HIGH (ref 0–149)
VLDL Cholesterol Cal: 38 mg/dL (ref 5–40)

## 2018-02-18 LAB — TSH: TSH: 3.34 u[IU]/mL (ref 0.450–4.500)

## 2018-02-18 LAB — CBC WITH DIFFERENTIAL/PLATELET
Basophils Absolute: 0 10*3/uL (ref 0.0–0.2)
Basos: 0 %
EOS (ABSOLUTE): 0.1 10*3/uL (ref 0.0–0.4)
EOS: 1 %
HEMOGLOBIN: 13.6 g/dL (ref 11.1–15.9)
Hematocrit: 41.2 % (ref 34.0–46.6)
Immature Grans (Abs): 0 10*3/uL (ref 0.0–0.1)
Immature Granulocytes: 0 %
LYMPHS ABS: 2.7 10*3/uL (ref 0.7–3.1)
Lymphs: 27 %
MCH: 28.3 pg (ref 26.6–33.0)
MCHC: 33 g/dL (ref 31.5–35.7)
MCV: 86 fL (ref 79–97)
MONOCYTES: 6 %
Monocytes Absolute: 0.6 10*3/uL (ref 0.1–0.9)
NEUTROS ABS: 6.5 10*3/uL (ref 1.4–7.0)
Neutrophils: 66 %
Platelets: 331 10*3/uL (ref 150–379)
RBC: 4.8 x10E6/uL (ref 3.77–5.28)
RDW: 14.4 % (ref 12.3–15.4)
WBC: 9.9 10*3/uL (ref 3.4–10.8)

## 2018-02-18 LAB — T4, FREE: Free T4: 0.92 ng/dL (ref 0.82–1.77)

## 2018-02-18 LAB — HEMOGLOBIN A1C
ESTIMATED AVERAGE GLUCOSE: 134 mg/dL
HEMOGLOBIN A1C: 6.3 % — AB (ref 4.8–5.6)

## 2018-02-18 NOTE — Telephone Encounter (Signed)
Prior Auth for saxenda started. Waiting on Auth.

## 2018-02-19 NOTE — Telephone Encounter (Signed)
Incoming fax received from [facility] on 02/18/18. Saxenda 18mg /51ml approved. Phone call to [facility] [place], pharmacy states they ran it through yesterday and it was approved, patient picked medication up.

## 2018-02-20 ENCOUNTER — Ambulatory Visit
Admission: RE | Admit: 2018-02-20 | Discharge: 2018-02-20 | Disposition: A | Discharge status: Home / Self Care | Payer: BC Managed Care – PPO | Source: Ambulatory Visit | Attending: Family Medicine | Admitting: Family Medicine

## 2018-02-20 ENCOUNTER — Telehealth: Payer: Self-pay

## 2018-02-20 DIAGNOSIS — K119 Disease of salivary gland, unspecified: Secondary | ICD-10-CM | POA: Diagnosis not present

## 2018-02-20 DIAGNOSIS — R221 Localized swelling, mass and lump, neck: Secondary | ICD-10-CM | POA: Diagnosis present

## 2018-02-20 MED ORDER — IOHEXOL 300 MG/ML  SOLN
75.0000 mL | Freq: Once | INTRAMUSCULAR | Status: AC | PRN
  Administered 2018-02-20: 75 mL via INTRAVENOUS

## 2018-02-20 NOTE — Telephone Encounter (Signed)
Imaging Report - STAT sent to Dr. Tamala Julian Confirmed with phone call.  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 Tissue sampling is warranted to assess the nature of what is most likely a neoplasm.   Electronically Signed   By: Staci Righter M.D.   On: 02/20/2018 12:50

## 2018-02-25 ENCOUNTER — Telehealth: Payer: Self-pay

## 2018-02-25 NOTE — Telephone Encounter (Signed)
Copied from [place] [id number]. Topic: Quick Communication - See Telephone Encounter >> Feb 25, 2018  9:00 AM Synthia Innocent wrote: CRM for notification. See Telephone encounter for: 02/25/18. Patient states she is now having pain right bottom jaw,(has mass in neck) wanted to let Dr Tamala Julian aware.

## 2018-02-26 NOTE — Telephone Encounter (Signed)
[facility] message sent to patient regarding results of CT neck.  Also called patient to follow-up on results, next steps, status of ENT appointment.

## 2018-02-26 NOTE — Telephone Encounter (Signed)
Spoke with patient; discussed CT results with patient.  Spoke with patient; no symptoms today.  Underwent dental evaluation last week; no dental pathology.  Underwent xrays at dentist.  Appointment tomorrow with ENT in [facility] for consultation.   No further actions warranted at this time.

## 2018-03-05 ENCOUNTER — Other Ambulatory Visit: Payer: Self-pay | Admitting: Cardiovascular Disease

## 2018-03-06 ENCOUNTER — Other Ambulatory Visit: Payer: Self-pay | Admitting: Otolaryngology

## 2018-03-06 DIAGNOSIS — K118 Other diseases of salivary glands: Secondary | ICD-10-CM

## 2018-03-07 ENCOUNTER — Other Ambulatory Visit: Payer: Self-pay | Admitting: Internal Medicine

## 2018-03-11 ENCOUNTER — Other Ambulatory Visit: Payer: Self-pay | Admitting: Radiology

## 2018-03-12 ENCOUNTER — Ambulatory Visit
Admission: RE | Admit: 2018-03-12 | Discharge: 2018-03-12 | Disposition: A | Discharge status: Home / Self Care | Payer: BC Managed Care – PPO | Source: Ambulatory Visit | Attending: Otolaryngology | Admitting: Otolaryngology

## 2018-03-12 ENCOUNTER — Other Ambulatory Visit: Payer: Self-pay | Admitting: Otolaryngology

## 2018-03-12 DIAGNOSIS — K118 Other diseases of salivary glands: Secondary | ICD-10-CM

## 2018-03-12 DIAGNOSIS — K119 Disease of salivary gland, unspecified: Secondary | ICD-10-CM | POA: Diagnosis present

## 2018-03-12 MED ORDER — HYDROCODONE-ACETAMINOPHEN 5-325 MG PO TABS: 1.0000 | ORAL_TABLET | ORAL | Status: DC | PRN

## 2018-03-12 MED ORDER — MIDAZOLAM HCL 5 MG/5ML IJ SOLN
INTRAMUSCULAR | Status: AC
  Filled 2018-03-12: qty 5

## 2018-03-12 MED ORDER — FENTANYL CITRATE (PF) 100 MCG/2ML IJ SOLN
INTRAMUSCULAR | Status: AC
  Filled 2018-03-12: qty 2

## 2018-03-12 MED ORDER — FENTANYL CITRATE (PF) 100 MCG/2ML IJ SOLN
INTRAMUSCULAR | Status: AC | PRN
  Administered 2018-03-12 (×2): 50 ug via INTRAVENOUS

## 2018-03-12 MED ORDER — MIDAZOLAM HCL 2 MG/2ML IJ SOLN
INTRAMUSCULAR | Status: AC | PRN
  Administered 2018-03-12 (×4): 1 mg via INTRAVENOUS

## 2018-03-12 MED ORDER — SODIUM CHLORIDE 0.9 % IV SOLN
INTRAVENOUS | Status: DC
Start: 2018-03-12 — End: 2018-03-13
  Administered 2018-03-12: 14:00:00 via INTRAVENOUS

## 2018-03-12 NOTE — Progress Notes (Signed)
Patient post parotid biopsy per Dr Vernard Gambles. Vitals stable, eating lung, husband at  Bedside. Discharge instructions given with questions answered. Denies complaints.

## 2018-03-12 NOTE — Procedures (Signed)
  Procedure: [place] FNA R parotid nodule 25g x4 EBL:   minimal Complications:  none immediate  See full dictation in [facility].  Dillard Cannon MD Main # [phone] Pager  [phone]

## 2018-03-12 NOTE — Discharge Instructions (Signed)

## 2018-03-14 LAB — CYTOLOGY - NON PAP

## 2018-03-18 ENCOUNTER — Other Ambulatory Visit: Payer: Self-pay

## 2018-03-18 ENCOUNTER — Encounter: Payer: Self-pay | Admitting: Family Medicine

## 2018-03-18 ENCOUNTER — Ambulatory Visit: Payer: BC Managed Care – PPO | Admitting: Family Medicine

## 2018-03-18 VITALS — BP 118/86 | HR 109 | Temp 98.0°F | Resp 16 | Ht 66.54 in | Wt 280.0 lb

## 2018-03-18 DIAGNOSIS — N3941 Urge incontinence: Secondary | ICD-10-CM

## 2018-03-18 DIAGNOSIS — I10 Essential (primary) hypertension: Secondary | ICD-10-CM

## 2018-03-18 DIAGNOSIS — R945 Abnormal results of liver function studies: Secondary | ICD-10-CM

## 2018-03-18 DIAGNOSIS — E7800 Pure hypercholesterolemia, unspecified: Secondary | ICD-10-CM

## 2018-03-18 DIAGNOSIS — E7439 Other disorders of intestinal carbohydrate absorption: Secondary | ICD-10-CM

## 2018-03-18 DIAGNOSIS — R221 Localized swelling, mass and lump, neck: Secondary | ICD-10-CM

## 2018-03-18 MED ORDER — LIRAGLUTIDE -WEIGHT MANAGEMENT 18 MG/3ML ~~LOC~~ SOPN: 1.2000 mg | PEN_INJECTOR | Freq: Every day | SUBCUTANEOUS | 1 refills | Status: DC

## 2018-03-18 NOTE — Progress Notes (Signed)
Subjective:    Patient ID: [name], female    DOB: 25-Apr-1976, 42 y.o.   MRN: [id number]  03/18/2018  Hypertension and Depression (with Anxiety )    HPI This 42 y.o. female presents for ONE MONTH FOLLOW-UP of hypertension, hypothyroidism, parotid gland mass, obesity. Weight down 8 pounds in one month; started Saxenda injection. Cannot quit drinking coke; must have one in the morning.  Then has one in evening.  Must get through the end of the school year. Public school is a big deal.   No tylenol; no alcohol.  Not exercising yet.   Cannot stay awake past seven. Breakfast shake. [employer]. Dinner/husband cooks or another [employer].  Not getting home until seven.  June 13 is last day of school.    Lab results from last visit: Urine is normal. Liver function studies (AST,. ALT) are elevated. I recommend avoiding Tylenol containing products and alcohol; I recommend repeating at your next visit. Cholesterol is extremely elevated; I recommend weight loss, exercise, and low-cholesterol low-fat food choices. I recommend limiting red meat to once per week; I recommend limiting fried foods to once per month. Thyroid function (TSH) is normal at 3.340. Hemoglobin A1c is a three month average of sugars; your level is elevated and consistent with a PREDIABETIC STATE.  I recommend weight loss, exercise, and low-carbohydrate low-sugar food choices. You should AVOID: regular sodas, sweetened tea, fruit juices. You should LIMIT: breads, pastas, rice, potatoes, and desserts/sweets. I would recommend limiting your total carbohydrate intake per meal to 45 grams; I would limit your total carbohydrate intake per snack to 30 grams. I would also have a goal of 60 grams of protein intake per day; this would equal 10-15 grams of protein per meal and 5-10 grams of protein per snack.     Immunization History  Administered Date(s) Administered  . PPD Test 01/05/2017  . Td 02/27/1997  . Tdap  10/30/2004    Review of Systems  Constitutional: Negative for chills, diaphoresis, fatigue and fever.  Eyes: Negative for visual disturbance.  Respiratory: Negative for cough and shortness of breath.   Cardiovascular: Negative for chest pain, palpitations and leg swelling.  Gastrointestinal: Negative for abdominal pain, constipation, diarrhea, nausea and vomiting.  Endocrine: Negative for cold intolerance, heat intolerance, polydipsia, polyphagia and polyuria.  Neurological: Negative for dizziness, tremors, seizures, syncope, facial asymmetry, speech difficulty, weakness, light-headedness, numbness and headaches.    Past Medical History:  Diagnosis Date  . Allergy   . Anxiety   . Depression   . Family history of adverse reaction to anesthesia    MOM-HARD TO WAKE UP  . Headache    MIGRAINES  . History of kidney stones    H/O  . History of methicillin resistant staphylococcus aureus (MRSA) 2009  . Hypertension    Past Surgical History:  Procedure Laterality Date  . CHOLECYSTECTOMY    . PAROTIDECTOMY Right 04/15/2018   Procedure: PAROTIDECTOMY;  Surgeon: Carloyn Manner, MD;  Location: [facility] ORS;  Service: ENT;  Laterality: Right;   Allergies  Allergen Reactions  . Sulfur Nausea Only  . Hctz [Hydrochlorothiazide]     Feels bad on this medication/ has headache.   Current Outpatient Medications on File Prior to Visit  Medication Sig Dispense Refill  . levothyroxine (SYNTHROID, LEVOTHROID) 50 MCG tablet TAKE ONE TABLET BY MOUTH DAILY BEFORE BREAKFAST 90 tablet 3   No current facility-administered medications on file prior to visit.    Social History   Socioeconomic History  .  Marital status: Married    Spouse name: Not on file  . Number of children: 2  . Years of education: Not on file  . Highest education level: Not on file  Occupational History  . Occupation: Pharmacist, hospital    Comment: Dispensing optician in [place]  . Financial resource strain:  Not on file  . Food insecurity:    Worry: Not on file    Inability: Not on file  . Transportation needs:    Medical: Not on file    Non-medical: Not on file  Tobacco Use  . Smoking status: Never Smoker  . Smokeless tobacco: Never Used  Substance and Sexual Activity  . Alcohol use: No  . Drug use: No  . Sexual activity: Yes    Birth control/protection: None  Lifestyle  . Physical activity:    Days per week: Not on file    Minutes per session: Not on file  . Stress: Not on file  Relationships  . Social connections:    Talks on phone: Not on file    Gets together: Not on file    Attends religious service: Not on file    Active member of club or organization: Not on file    Attends meetings of clubs or organizations: Not on file    Relationship status: Not on file  . Intimate partner violence:    Fear of current or ex partner: Not on file    Emotionally abused: Not on file    Physically abused: Not on file    Forced sexual activity: Not on file  Other Topics Concern  . Not on file  Social History Narrative   Marital status: married      Children: two (son 66 yo and daughter 63 yo)      Lives: with two children, husband.      Employment: Pharmacist, hospital at [employer]      Tobacco: none      Alcohol: none      Drugs: none      Exercise: walking sporadically   Family History  Problem Relation Age of Onset  . Diabetes Mother   . Heart disease Mother        CAD with stenting  . Hypertension Mother   . Hyperlipidemia Mother   . Hypothyroidism Mother   . Heart disease Father   . Heart disease Maternal Grandmother   . Stroke Maternal Grandmother   . Heart disease Paternal Grandmother   . Heart disease Paternal Grandfather        Objective:    BP 118/86   Pulse (!) 109   Temp 98 F (36.7 C) (Oral)   Resp 16   Ht 5' 6.54" (1.69 m)   Wt 280 lb (127 kg)   SpO2 95%   BMI 44.47 kg/m  Physical Exam  Constitutional: She is oriented to person, place, and  time. She appears well-developed and well-nourished. No distress.  HENT:  Head: Normocephalic and atraumatic.  Right Ear: External ear normal.  Left Ear: External ear normal.  Nose: Nose normal.  Mouth/Throat: Oropharynx is clear and moist.  Eyes: Pupils are equal, round, and reactive to light. Conjunctivae and EOM are normal.  Neck: Normal range of motion. Neck supple. Carotid bruit is not present. No thyromegaly present.  Cardiovascular: Normal rate, regular rhythm, normal heart sounds and intact distal pulses. Exam reveals no gallop and no friction rub.  No murmur heard. Pulmonary/Chest: Effort normal and breath sounds normal.  She has no wheezes. She has no rales.  Abdominal: Soft. Bowel sounds are normal. She exhibits no distension and no mass. There is no tenderness. There is no rebound and no guarding.  Lymphadenopathy:    She has no cervical adenopathy.  Neurological: She is alert and oriented to person, place, and time. No cranial nerve deficit.  Skin: Skin is warm and dry. No rash noted. She is not diaphoretic. No erythema. No pallor.  Psychiatric: She has a normal mood and affect. Her behavior is normal.   No results found. Depression screen [facility] 2/9 05/24/2018 03/18/2018 02/16/2018 12/16/2016 03/21/2016  Decreased Interest 0 0 0 0 0  Down, Depressed, Hopeless 0 0 0 0 0  PHQ - 2 Score 0 0 0 0 0   Fall Risk  05/24/2018 03/18/2018 02/16/2018 12/16/2016 03/21/2016  Falls in the past year? No No No No No  Risk for fall due to : - - - - -        Assessment & Plan:   1. Essential hypertension   2. Pure hypercholesterolemia   3. Glucose intolerance   4. Urge incontinence   5. Morbid obesity ([place])   6. Liver function study, abnormal   7. Localized swelling, mass or lump of neck     Hypercholesterolemia glucose tolerance and hypertension: Uncontrolled.  Recommend weight loss, exercise, low sugar and low-cholesterol food choices.  Liver function studies abnormal: New.  Return to  clinic in 6 weeks for repeat.  Likely secondary to NASH.  Avoid Tylenol-containing products and alcohol.  Urge incontinence: Uncontrolled.  Recommend weight loss and exercise and pelvic floor strengthening activities.  Parotid mass:  S/p CT soft tissue neck with US biopsy of parotid mass; benign pathology; scheduled for surgical parotidectomy on 04/15/18 by Vaught.  Morbid obesity: Increase Saxenda to 1.2 mg daily.  Recommend weight loss, exercise for 30-60 minutes five days per week; recommend 1200 kcal restriction per day with a minimum of 60 grams of protein per day.  Eat 3 meals per day. Do not skip meals. Consider having a protein shake as a meal replacement to aid with eliminating meal skipping. Look for products with <220 calories, <7 gm sugar, and 20-30 gm protein.  Eat breakfast within 2 hours of getting up.   Make  your plate non-starchy vegetables,  protein, and  carbohydrates at lunch and dinner.   Aim for at least 64 oz. of calorie-free beverages daily (water, Crystal Light, diet green tea, etc.). Eliminate any sugary beverages such as regular soda, sweet tea, or fruit juice.   Pay attention to hunger and fullness cues.  Stop eating once you feel satisfied; don't wait until you feel full, stuffed, or sick from eating.  Choose lean meats and low fat/fat free dairy products.  Choose foods high in fiber such as fruits, vegetables, and whole grains (brown rice, whole wheat pasta, whole wheat bread, etc.).  Limit foods with added sugar to <7 gm per serving.  Always eat in the kitchen/dining room.  Never eat in the bedroom or in front of the TV.    No orders of the defined types were placed in this encounter.  Meds ordered this encounter  Medications  . DISCONTD: Liraglutide -Weight Management (SAXENDA) 18 MG/3ML SOPN    Sig: Inject 1.2 mg into the skin daily.    Dispense:  3 mL    Refill:  1    Return in about 6 weeks (around 04/29/2018) for recheck LFTs,  weight.   Kristi  Elayne Guerin, M.D. Primary Care at [facility] [address] [place], [place]  [phone] phone [phone] fax

## 2018-03-18 NOTE — Patient Instructions (Signed)
     IF you received an x-ray today, you will receive an invoice from [facility]. Please contact [facility] at [phone] with questions or concerns regarding your invoice.   IF you received labwork today, you will receive an invoice from [facility]. Please contact [facility] at [phone] with questions or concerns regarding your invoice.   Our billing staff will not be able to assist you with questions regarding bills from these companies.  You will be contacted with the lab results as soon as they are available. The fastest way to get your results is to activate your My Chart account. Instructions are located on the last page of this paperwork. If you have not heard from us regarding the results in 2 weeks, please contact this office.     

## 2018-03-21 ENCOUNTER — Encounter: Payer: Self-pay | Admitting: Family Medicine

## 2018-04-09 ENCOUNTER — Encounter
Admission: RE | Admit: 2018-04-09 | Discharge: 2018-04-09 | Disposition: A | Discharge status: Home / Self Care | Payer: BC Managed Care – PPO | Source: Ambulatory Visit | Attending: Otolaryngology | Admitting: Otolaryngology

## 2018-04-09 ENCOUNTER — Other Ambulatory Visit: Payer: Self-pay

## 2018-04-09 HISTORY — DX: Family history of other specified conditions: Z84.89

## 2018-04-09 HISTORY — DX: Headache: R51

## 2018-04-09 HISTORY — DX: Headache, unspecified: R51.9

## 2018-04-09 HISTORY — DX: Personal history of Methicillin resistant Staphylococcus aureus infection: Z86.14

## 2018-04-09 HISTORY — DX: Personal history of urinary calculi: Z87.442

## 2018-04-09 NOTE — Patient Instructions (Signed)
Your procedure is scheduled on: 04-15-18 MONDAY Report to Same Day Surgery 2nd floor medical mall [facility] Entrance-take elevator on left to 2nd floor.  Check in with surgery information desk.) To find out your arrival time please call [phone] between 1PM - 3PM on 04-12-18 FRIDAY  Remember: Instructions that are not followed completely may result in serious medical risk, up to and including death, or upon the discretion of your surgeon and anesthesiologist your surgery may need to be rescheduled.    _x___ 1. Do not eat food after midnight the night before your procedure. You may drink clear liquids up to 2 hours before you are scheduled to arrive at the hospital for your procedure.  Do not drink clear liquids within 2 hours of your scheduled arrival to the hospital.  Clear liquids include  --Water or Apple juice without pulp  --Clear carbohydrate beverage such as ClearFast or Gatorade  --Black Coffee or Clear Tea (No milk, no creamers, do not add anything to the coffee or Tea     __x__ 2. No Alcohol for 24 hours before or after surgery.   __x__3. No Smoking or e-cigarettes for 24 prior to surgery.  Do not use any chewable tobacco products for at least 6 hour prior to surgery   ____  4. Bring all medications with you on the day of surgery if instructed.    __x__ 5. Notify your doctor if there is any change in your medical condition     (cold, fever, infections).    x___6. On the morning of surgery brush your teeth with toothpaste and water.  You may rinse your mouth with mouth wash if you wish.  Do not swallow any toothpaste or mouthwash.   Do not wear jewelry, make-up, hairpins, clips or nail polish.  Do not wear lotions, powders, or perfumes. You may wear deodorant.  Do not shave 48 hours prior to surgery. Men may shave face and neck.  Do not bring valuables to the hospital.    [facility] is not responsible for any belongings or valuables.               Contacts, dentures  or bridgework may not be worn into surgery.  Leave your suitcase in the car. After surgery it may be brought to your room.  For patients admitted to the hospital, discharge time is determined by your  treatment team.  _  Patients discharged the day of surgery will not be allowed to drive home.  You will need someone to drive you home and stay with you the night of your procedure.    Please read over the following fact sheets that you were given:   [facility] Preparing for Surgery and or MRSA Information   _x___ TAKE THE FOLLOWING MEDICATION THE MORNING OF YOUR SURGERY WITH A SMALL SIP OF WATER. These include:  1. AMLODIPINE (NORVASC)  2. METOPROLOL  3. ZOLOFT (SERTRALINE)  4.  5.  6.  ____Fleets enema or Magnesium Citrate as directed.   _x___ Use CHG Soap or sage wipes as directed on instruction sheet   ____ Use inhalers on the day of surgery and bring to hospital day of surgery  ____ Stop Metformin and Janumet 2 days prior to surgery.    ____ Take 1/2 of usual insulin dose the night before surgery and none on the morning surgery.   ____ Follow recommendations from Cardiologist, Pulmonologist or PCP regarding stopping Aspirin, Coumadin, Plavix ,Eliquis, Effient, or Pradaxa, and Pletal.  X____Stop Anti-inflammatories such as Advil, Aleve, Ibuprofen, Motrin, Naproxen, Naprosyn, Goodies powders or aspirin products NOW-OK to take Tylenol   ____ Stop supplements until after surgery.     ____ Bring C-Pap to the hospital.

## 2018-04-12 ENCOUNTER — Encounter
Admission: RE | Admit: 2018-04-12 | Discharge: 2018-04-12 | Disposition: A | Discharge status: Home / Self Care | Payer: BC Managed Care – PPO | Source: Ambulatory Visit | Attending: Family Medicine | Admitting: Family Medicine

## 2018-04-12 DIAGNOSIS — Z01812 Encounter for preprocedural laboratory examination: Secondary | ICD-10-CM | POA: Insufficient documentation

## 2018-04-12 LAB — SURGICAL PCR SCREEN
MRSA, PCR: NEGATIVE
STAPHYLOCOCCUS AUREUS: POSITIVE — AB

## 2018-04-13 NOTE — Pre-Procedure Instructions (Signed)
Positive staph aureus results sent to Dr. Carmin Richmond for review.  Asked if wanted any treatment?

## 2018-04-15 ENCOUNTER — Encounter: Payer: Self-pay | Admitting: Otolaryngology

## 2018-04-15 ENCOUNTER — Encounter
Admission: RE | Disposition: A | Discharge status: Home / Self Care | Payer: Self-pay | Source: Ambulatory Visit | Attending: Otolaryngology

## 2018-04-15 ENCOUNTER — Ambulatory Visit: Payer: BC Managed Care – PPO | Admitting: Anesthesiology

## 2018-04-15 ENCOUNTER — Other Ambulatory Visit: Payer: Self-pay

## 2018-04-15 ENCOUNTER — Observation Stay
Admission: RE | Admit: 2018-04-15 | Discharge: 2018-04-16 | Disposition: A | Discharge status: Home / Self Care | Payer: BC Managed Care – PPO | Source: Ambulatory Visit | Attending: Otolaryngology | Admitting: Otolaryngology

## 2018-04-15 DIAGNOSIS — Z8614 Personal history of Methicillin resistant Staphylococcus aureus infection: Secondary | ICD-10-CM | POA: Diagnosis not present

## 2018-04-15 DIAGNOSIS — D110 Benign neoplasm of parotid gland: Secondary | ICD-10-CM | POA: Diagnosis not present

## 2018-04-15 DIAGNOSIS — Z9049 Acquired absence of other specified parts of digestive tract: Secondary | ICD-10-CM | POA: Diagnosis not present

## 2018-04-15 DIAGNOSIS — F419 Anxiety disorder, unspecified: Secondary | ICD-10-CM | POA: Diagnosis not present

## 2018-04-15 DIAGNOSIS — Z6841 Body Mass Index (BMI) 40.0 and over, adult: Secondary | ICD-10-CM | POA: Diagnosis not present

## 2018-04-15 DIAGNOSIS — Z9889 Other specified postprocedural states: Secondary | ICD-10-CM

## 2018-04-15 DIAGNOSIS — I10 Essential (primary) hypertension: Secondary | ICD-10-CM | POA: Insufficient documentation

## 2018-04-15 DIAGNOSIS — R51 Headache: Secondary | ICD-10-CM | POA: Diagnosis not present

## 2018-04-15 DIAGNOSIS — K219 Gastro-esophageal reflux disease without esophagitis: Secondary | ICD-10-CM | POA: Diagnosis not present

## 2018-04-15 DIAGNOSIS — F329 Major depressive disorder, single episode, unspecified: Secondary | ICD-10-CM | POA: Insufficient documentation

## 2018-04-15 HISTORY — PX: PAROTIDECTOMY: SHX2163

## 2018-04-15 LAB — POCT PREGNANCY, URINE: Preg Test, Ur: NEGATIVE

## 2018-04-15 SURGERY — EXCISION, PAROTID GLAND
Anesthesia: General | Laterality: Right | Wound class: Clean

## 2018-04-15 MED ORDER — EPHEDRINE SULFATE 50 MG/ML IJ SOLN
INTRAMUSCULAR | Status: AC
  Filled 2018-04-15: qty 1

## 2018-04-15 MED ORDER — ONDANSETRON HCL 4 MG/2ML IJ SOLN
INTRAMUSCULAR | Status: DC | PRN
  Administered 2018-04-15: 4 mg via INTRAVENOUS

## 2018-04-15 MED ORDER — ONDANSETRON HCL 4 MG PO TABS: 4.0000 mg | ORAL_TABLET | ORAL | Status: DC | PRN

## 2018-04-15 MED ORDER — DEXAMETHASONE SODIUM PHOSPHATE 10 MG/ML IJ SOLN
INTRAMUSCULAR | Status: DC | PRN
  Administered 2018-04-15: 10 mg via INTRAVENOUS

## 2018-04-15 MED ORDER — ACETAMINOPHEN 160 MG/5ML PO SOLN
650.0000 mg | ORAL | Status: DC | PRN
  Filled 2018-04-15: qty 20.3

## 2018-04-15 MED ORDER — ACETAMINOPHEN 325 MG PO TABS: 325.0000 mg | ORAL_TABLET | ORAL | Status: DC | PRN

## 2018-04-15 MED ORDER — LIDOCAINE HCL (PF) 2 % IJ SOLN
INTRAMUSCULAR | Status: AC
  Filled 2018-04-15: qty 10

## 2018-04-15 MED ORDER — MIDAZOLAM HCL 2 MG/2ML IJ SOLN
INTRAMUSCULAR | Status: AC
  Filled 2018-04-15: qty 2

## 2018-04-15 MED ORDER — FAMOTIDINE 20 MG PO TABS
20.0000 mg | ORAL_TABLET | Freq: Once | ORAL | Status: AC
  Administered 2018-04-15: 20 mg via ORAL

## 2018-04-15 MED ORDER — LIRAGLUTIDE -WEIGHT MANAGEMENT 18 MG/3ML ~~LOC~~ SOPN: 1.2000 mg | PEN_INJECTOR | Freq: Every day | SUBCUTANEOUS | Status: DC

## 2018-04-15 MED ORDER — PHENYLEPHRINE HCL 10 MG/ML IJ SOLN
INTRAMUSCULAR | Status: DC | PRN
  Administered 2018-04-15 (×3): 100 ug via INTRAVENOUS
  Administered 2018-04-15 (×2): 200 ug via INTRAVENOUS
  Administered 2018-04-15: 100 ug via INTRAVENOUS

## 2018-04-15 MED ORDER — METOPROLOL SUCCINATE ER 25 MG PO TB24
25.0000 mg | ORAL_TABLET | ORAL | Status: DC
  Administered 2018-04-15: 25 mg via ORAL
  Filled 2018-04-15 (×2): qty 1

## 2018-04-15 MED ORDER — HYDROMORPHONE HCL 1 MG/ML IJ SOLN
INTRAMUSCULAR | Status: DC | PRN
  Administered 2018-04-15: 0.5 mg via INTRAVENOUS

## 2018-04-15 MED ORDER — DEXTROSE-NACL 5-0.45 % IV SOLN
INTRAVENOUS | Status: DC
  Administered 2018-04-15: 12:00:00 via INTRAVENOUS

## 2018-04-15 MED ORDER — ACETAMINOPHEN 10 MG/ML IV SOLN
INTRAVENOUS | Status: DC | PRN
  Administered 2018-04-15: 1000 mg via INTRAVENOUS

## 2018-04-15 MED ORDER — FENTANYL CITRATE (PF) 100 MCG/2ML IJ SOLN
INTRAMUSCULAR | Status: DC | PRN
  Administered 2018-04-15: 150 ug via INTRAVENOUS

## 2018-04-15 MED ORDER — KETOROLAC TROMETHAMINE 30 MG/ML IJ SOLN
INTRAMUSCULAR | Status: AC
  Filled 2018-04-15: qty 1

## 2018-04-15 MED ORDER — PROPOFOL 10 MG/ML IV BOLUS
INTRAVENOUS | Status: AC
  Filled 2018-04-15: qty 20

## 2018-04-15 MED ORDER — SUCCINYLCHOLINE CHLORIDE 20 MG/ML IJ SOLN
INTRAMUSCULAR | Status: AC
  Filled 2018-04-15: qty 1

## 2018-04-15 MED ORDER — CEFAZOLIN SODIUM-DEXTROSE 1-4 GM/50ML-% IV SOLN
INTRAVENOUS | Status: DC | PRN
  Administered 2018-04-15: 2 g via INTRAVENOUS

## 2018-04-15 MED ORDER — FENTANYL CITRATE (PF) 100 MCG/2ML IJ SOLN
INTRAMUSCULAR | Status: AC
  Filled 2018-04-15: qty 2

## 2018-04-15 MED ORDER — ZOLPIDEM TARTRATE 5 MG PO TABS
5.0000 mg | ORAL_TABLET | Freq: Every evening | ORAL | Status: DC | PRN
  Administered 2018-04-15: 5 mg via ORAL
  Filled 2018-04-15: qty 1

## 2018-04-15 MED ORDER — ACETAMINOPHEN 650 MG RE SUPP: 650.0000 mg | RECTAL | Status: DC | PRN

## 2018-04-15 MED ORDER — ACETAMINOPHEN 160 MG/5ML PO SOLN
325.0000 mg | ORAL | Status: DC | PRN
  Filled 2018-04-15: qty 20.3

## 2018-04-15 MED ORDER — FENTANYL CITRATE (PF) 250 MCG/5ML IJ SOLN
INTRAMUSCULAR | Status: AC
  Filled 2018-04-15: qty 5

## 2018-04-15 MED ORDER — EPHEDRINE SULFATE 50 MG/ML IJ SOLN
INTRAMUSCULAR | Status: DC | PRN
  Administered 2018-04-15 (×3): 10 mg via INTRAVENOUS

## 2018-04-15 MED ORDER — PHENYLEPHRINE HCL 10 MG/ML IJ SOLN
INTRAMUSCULAR | Status: AC
  Filled 2018-04-15: qty 1

## 2018-04-15 MED ORDER — HYDROMORPHONE HCL 1 MG/ML IJ SOLN
INTRAMUSCULAR | Status: AC
  Administered 2018-04-15: 0.5 mg via INTRAVENOUS
  Filled 2018-04-15: qty 1

## 2018-04-15 MED ORDER — SUCCINYLCHOLINE CHLORIDE 20 MG/ML IJ SOLN
INTRAMUSCULAR | Status: DC | PRN
  Administered 2018-04-15: 140 mg via INTRAVENOUS

## 2018-04-15 MED ORDER — HYDROCODONE-ACETAMINOPHEN 7.5-325 MG/15ML PO SOLN
10.0000 mL | ORAL | Status: DC | PRN
  Administered 2018-04-15 – 2018-04-16 (×4): 15 mL via ORAL
  Filled 2018-04-15 (×4): qty 15

## 2018-04-15 MED ORDER — LIDOCAINE-EPINEPHRINE 1 %-1:100000 IJ SOLN
INTRAMUSCULAR | Status: AC
  Filled 2018-04-15: qty 1

## 2018-04-15 MED ORDER — REMIFENTANIL HCL 1 MG IV SOLR
INTRAVENOUS | Status: DC | PRN
  Administered 2018-04-15: .15 ug/kg/min via INTRAVENOUS

## 2018-04-15 MED ORDER — LACTATED RINGERS IV SOLN
INTRAVENOUS | Status: DC | PRN
  Administered 2018-04-15: 07:00:00 via INTRAVENOUS

## 2018-04-15 MED ORDER — LIDOCAINE-EPINEPHRINE 1 %-1:100000 IJ SOLN
INTRAMUSCULAR | Status: DC | PRN
  Administered 2018-04-15: 8 mL

## 2018-04-15 MED ORDER — BACITRACIN ZINC 500 UNIT/GM EX OINT
TOPICAL_OINTMENT | CUTANEOUS | Status: DC | PRN
  Administered 2018-04-15: 1 via TOPICAL

## 2018-04-15 MED ORDER — REMIFENTANIL HCL 1 MG IV SOLR
INTRAVENOUS | Status: AC
  Filled 2018-04-15: qty 1000

## 2018-04-15 MED ORDER — ONDANSETRON HCL 4 MG/2ML IJ SOLN: 4.0000 mg | INTRAMUSCULAR | Status: DC | PRN

## 2018-04-15 MED ORDER — PROPOFOL 10 MG/ML IV BOLUS
INTRAVENOUS | Status: DC | PRN
  Administered 2018-04-15: 200 mg via INTRAVENOUS

## 2018-04-15 MED ORDER — BACITRACIN ZINC 500 UNIT/GM EX OINT
TOPICAL_OINTMENT | CUTANEOUS | Status: AC
  Filled 2018-04-15: qty 28.35

## 2018-04-15 MED ORDER — HYDROMORPHONE HCL 1 MG/ML IJ SOLN
0.2500 mg | INTRAMUSCULAR | Status: DC | PRN
  Administered 2018-04-15 (×2): 0.5 mg via INTRAVENOUS

## 2018-04-15 MED ORDER — SERTRALINE HCL 50 MG PO TABS
200.0000 mg | ORAL_TABLET | ORAL | Status: DC
  Administered 2018-04-15: 200 mg via ORAL
  Filled 2018-04-15 (×2): qty 4

## 2018-04-15 MED ORDER — LEVOTHYROXINE SODIUM 50 MCG PO TABS: 50.0000 ug | ORAL_TABLET | Freq: Every day | ORAL | Status: DC

## 2018-04-15 MED ORDER — HYDROMORPHONE HCL 1 MG/ML IJ SOLN
INTRAMUSCULAR | Status: AC
  Filled 2018-04-15: qty 1

## 2018-04-15 MED ORDER — AMLODIPINE BESYLATE 5 MG PO TABS
5.0000 mg | ORAL_TABLET | ORAL | Status: DC
  Administered 2018-04-15: 5 mg via ORAL
  Filled 2018-04-15 (×2): qty 1

## 2018-04-15 MED ORDER — LACTATED RINGERS IV SOLN
INTRAVENOUS | Status: DC
  Administered 2018-04-15: 07:00:00 via INTRAVENOUS

## 2018-04-15 MED ORDER — HYDROCODONE-ACETAMINOPHEN 7.5-325 MG PO TABS
1.0000 | ORAL_TABLET | Freq: Once | ORAL | Status: DC | PRN
  Filled 2018-04-15: qty 1

## 2018-04-15 MED ORDER — ACETAMINOPHEN 10 MG/ML IV SOLN
INTRAVENOUS | Status: AC
  Filled 2018-04-15: qty 100

## 2018-04-15 MED ORDER — PROMETHAZINE HCL 25 MG/ML IJ SOLN: 6.2500 mg | INTRAMUSCULAR | Status: DC | PRN

## 2018-04-15 MED ORDER — MORPHINE SULFATE (PF) 2 MG/ML IV SOLN: 2.0000 mg | INTRAVENOUS | Status: DC | PRN

## 2018-04-15 MED ORDER — DEXMEDETOMIDINE HCL IN NACL 200 MCG/50ML IV SOLN
INTRAVENOUS | Status: AC
  Filled 2018-04-15: qty 50

## 2018-04-15 MED ORDER — BACITRACIN ZINC 500 UNIT/GM EX OINT
1.0000 | TOPICAL_OINTMENT | Freq: Three times a day (TID) | CUTANEOUS | Status: DC
Start: 2018-04-15 — End: 2018-04-16
  Administered 2018-04-15 – 2018-04-16 (×3): 1 via TOPICAL
  Filled 2018-04-15 (×4): qty 0.9

## 2018-04-15 MED ORDER — KETOROLAC TROMETHAMINE 30 MG/ML IJ SOLN
30.0000 mg | Freq: Once | INTRAMUSCULAR | Status: AC | PRN
  Administered 2018-04-15: 30 mg via INTRAVENOUS

## 2018-04-15 MED ORDER — FAMOTIDINE 20 MG PO TABS
ORAL_TABLET | ORAL | Status: AC
  Filled 2018-04-15: qty 1

## 2018-04-15 MED ORDER — MEPERIDINE HCL 50 MG/ML IJ SOLN: 6.2500 mg | INTRAMUSCULAR | Status: DC | PRN

## 2018-04-15 MED ORDER — SENNOSIDES-DOCUSATE SODIUM 8.6-50 MG PO TABS: 1.0000 | ORAL_TABLET | Freq: Every evening | ORAL | Status: DC | PRN

## 2018-04-15 MED ORDER — LIDOCAINE HCL (CARDIAC) PF 100 MG/5ML IV SOSY
PREFILLED_SYRINGE | INTRAVENOUS | Status: DC | PRN
  Administered 2018-04-15: 100 mg via INTRAVENOUS

## 2018-04-15 MED ORDER — DEXMEDETOMIDINE HCL 200 MCG/2ML IV SOLN
INTRAVENOUS | Status: DC | PRN
  Administered 2018-04-15: 16 ug via INTRAVENOUS

## 2018-04-15 SURGICAL SUPPLY — 48 items
ADH LQ OCL WTPRF AMP STRL LF (MISCELLANEOUS)
ADH SKN CLS APL DERMABOND .7 (GAUZE/BANDAGES/DRESSINGS) ×1
ADHESIVE MASTISOL STRL (MISCELLANEOUS) ×1 IMPLANT
BLADE SURG 15 STRL LF DISP TIS (BLADE) ×1 IMPLANT
BLADE SURG 15 STRL SS (BLADE) ×3
CANISTER SUCT 1200ML W/VALVE (MISCELLANEOUS) ×3 IMPLANT
CLOSURE WOUND 1/4X4 (GAUZE/BANDAGES/DRESSINGS) ×1
CORD BIP STRL DISP 12FT (MISCELLANEOUS) ×3 IMPLANT
COTTON BALL STRL MEDIUM (GAUZE/BANDAGES/DRESSINGS) ×1 IMPLANT
DERMABOND ADVANCED (GAUZE/BANDAGES/DRESSINGS) ×2
DERMABOND ADVANCED .7 DNX12 (GAUZE/BANDAGES/DRESSINGS) ×1 IMPLANT
DRAIN TLS ROUND 10FR (DRAIN) ×3 IMPLANT
DRAPE MAG INST 16X20 L/F (DRAPES) ×3 IMPLANT
DRAPE SURG 17X11 SM STRL (DRAPES) ×3 IMPLANT
DRSG TEGADERM 2-3/8X2-3/4 SM (GAUZE/BANDAGES/DRESSINGS) ×7 IMPLANT
ELECT EMG 20MM DUAL (MISCELLANEOUS) ×6
ELECT NEEDLE 20X.3 GREEN (MISCELLANEOUS) ×3
ELECT REM PT RETURN 9FT ADLT (ELECTROSURGICAL) ×3
ELECTRODE EMG 20MM DUAL (MISCELLANEOUS) IMPLANT
ELECTRODE NDL 20X.3 GREEN (MISCELLANEOUS) ×1 IMPLANT
ELECTRODE NEEDLE 20X.3 GREEN (MISCELLANEOUS) ×1 IMPLANT
ELECTRODE REM PT RTRN 9FT ADLT (ELECTROSURGICAL) ×1 IMPLANT
FORCEPS JEWEL BIP 4-3/4 STR (INSTRUMENTS) ×3 IMPLANT
GLOVE BIO SURGEON STRL SZ7.5 (GLOVE) ×8 IMPLANT
GOWN STRL REUS W/ TWL LRG LVL3 (GOWN DISPOSABLE) ×3 IMPLANT
GOWN STRL REUS W/TWL LRG LVL3 (GOWN DISPOSABLE) ×9
HEMOSTAT SURGICEL 2X3 (HEMOSTASIS) ×2 IMPLANT
HOOK STAY BLUNT/RETRACTOR 5M (MISCELLANEOUS) ×3 IMPLANT
JACKSON PRATT 7MM (INSTRUMENTS) ×1 IMPLANT
KIT TURNOVER KIT A (KITS) ×3 IMPLANT
LABEL OR SOLS (LABEL) ×3 IMPLANT
MARKER SKIN DUAL TIP RULER LAB (MISCELLANEOUS) ×3 IMPLANT
NS IRRIG 500ML POUR BTL (IV SOLUTION) ×3 IMPLANT
PACK HEAD/NECK (MISCELLANEOUS) ×3 IMPLANT
PROBE MONO 100X0.75 ELECT 1.9M (MISCELLANEOUS) ×3 IMPLANT
SHEARS HARMONIC 9CM CVD (BLADE) ×3 IMPLANT
SPONGE KITTNER 5P (MISCELLANEOUS) ×3 IMPLANT
SPONGE XRAY 4X4 16PLY STRL (MISCELLANEOUS) ×3 IMPLANT
STRIP CLOSURE SKIN 1/4X4 (GAUZE/BANDAGES/DRESSINGS) ×2 IMPLANT
SUT MERSILENE 4-0 WHT RB-1 (SUTURE) ×8 IMPLANT
SUT PROLENE 5 0 PS 3 (SUTURE) ×3 IMPLANT
SUT PROLENE 6 0 P 1 18 (SUTURE) ×4 IMPLANT
SUT SILK 0 (SUTURE) ×3
SUT SILK 0 30XBRD TIE 6 (SUTURE) ×1 IMPLANT
SUT SILK 2 0 (SUTURE) ×3
SUT SILK 2-0 30XBRD TIE 12 (SUTURE) ×1 IMPLANT
SUT VIC AB 4-0 RB1 18 (SUTURE) ×5 IMPLANT
SYSTEM CHEST DRAIN TLS 7FR (DRAIN) ×1 IMPLANT

## 2018-04-15 NOTE — Op Note (Signed)
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  Right facial nerve identified and all branches dissection.  All branches stimulated well at completion of case.  Indications:Enlarging right superficial parotid mass, FNA biopsy consistent with pleomorphic adenoma  Description of Procedure: After the patient was identified in hold and the history and physical and consent was reviewed and updated. The patient was marked and taken to the operating room and placed in a supine position. General endotracheal anesthesia  Facial nerve monitoring probes were placed in the orbicularis oculia and orbicularis oris muscles and set up in the usual fashion.  This demonstrated good monitoring of the inferior and superior branches of the facial nerve throughout the case. The patient's right neck injected with 8cc's of 1% lidociane with 1:200,000 Epinephrine. The patient was next prepped and draped in a sterile normal fashion.  At this time, a 15 blade scalpel was used to make a skin incision in the right preauricular crease and extend down to a  marked anterior neck crease. Dissection was carefully performed through the subcutaneous tissues with combination of Bovie electrocautery and blunt dissection.  The parotid facia was encountered.  This was gently dissected away from the SCM inferior and the cartilagenous canal superiorly.  A superficial flap was elevated on the patient's midface just overlying the parotid fascia.  At this time dural hooks were used to retract the ear lobe, anterior flap and posterior flap for visualization.    Careful dissection at this time was directed to identification of the facial nerve.   Approximately 1cm inferior to the tympanomastoid suture and at the level of the digastric the facial nerve trunk was identified.  This was carefully stimulated.  Remaining attachments of the parotid to the external canal were divided.  With the nerve under direct visualization, Harmonic scalpel was used to divide the parotid tissue.  The pez was identified and the superior and inferior branches seen and dissected.  The parotid was was palpated and noted to be anteriorly in the superficial parotid tissue.  Therefore the superior branches were carefully dissected away until superior and anterior the parotid mass.  The inferior branches were next dissected inferiorly away from the facial nerve.  Careful attention was made to avoid any trauma to any branch of the facial nerve.  Once the inferior branches were dissected away, the remaining attachments of the parotid/tumor dissection were divided.  Care was taken to observe all branches of the facial nerve at all times.  This was passed off the table for permanent evaluation.  At this time, the wound was copiously irrigated.  All branches of the facial nerve were gently stimulated.  Surgicel was placed overlying the facial nerve and size 10 french suction TLS placed in the wound bed.  The SMAS was elevated and used to close dead space of the previous parotid and attached with merselene to the SCM fascia using 4 figure of 8 sutures.  This created some redundant skin that was trimmed.  At this time the subcutaneous skin was closed with 4.0 vicryl and the skin was closed with running locking 6.0 prolene and topped with bacitracin.  At this time the patient was extubated and taken to PACU  in good condition.  Plan: Admit for observation and drain care.  Follow pathology.  Limit activity for 2 weeks. Follow up next week for post-operative evaluation and suture removal.  Odel Schmid  04/15/2018 9:43 AM

## 2018-04-15 NOTE — Anesthesia Postprocedure Evaluation (Signed)
Anesthesia Post Note  Patient: [name]  Procedure(s) Performed: PAROTIDECTOMY (Right )  Patient location during evaluation: PACU Anesthesia Type: General Level of consciousness: awake and alert Pain management: pain level controlled Vital Signs Assessment: post-procedure vital signs reviewed and stable Respiratory status: spontaneous breathing, nonlabored ventilation, respiratory function stable and patient connected to nasal cannula oxygen Cardiovascular status: blood pressure returned to baseline and stable Postop Assessment: no apparent nausea or vomiting Anesthetic complications: no     Last Vitals:  Vitals:   04/15/18 1120 04/15/18 1243  BP: 124/70 109/62  Pulse: 97 98  Resp: 20 16  Temp: 36.7 C 36.4 C  SpO2: 93% 94%    Last Pain:  Vitals:   04/15/18 1243  TempSrc: Oral  PainSc:                  Alphonsus Sias

## 2018-04-15 NOTE — Transfer of Care (Signed)
Immediate Anesthesia Transfer of Care Note  Patient: [name]  Procedure(s) Performed: PAROTIDECTOMY (Right )  Patient Location: PACU  Anesthesia Type:General  Level of Consciousness: sedated  Airway & Oxygen Therapy: Patient Spontanous Breathing and Patient connected to face mask oxygen  Post-op Assessment: Report given to RN and Post -op Vital signs reviewed and stable  Post vital signs: Reviewed and stable  Last Vitals:  Vitals Value Taken Time  BP 144/73 04/15/2018  9:55 AM  Temp 36.2 C 04/15/2018  9:55 AM  Pulse 105 04/15/2018 10:02 AM  Resp 19 04/15/2018 10:02 AM  SpO2 93 % 04/15/2018 10:02 AM  Vitals shown include unvalidated device data.  Last Pain:  Vitals:   04/15/18 0648  TempSrc: Tympanic  PainSc: 0-No pain         Complications: No apparent anesthesia complications

## 2018-04-15 NOTE — H&P (Signed)
..  History and Physical paper copy reviewed and updated date of procedure and will be scanned into system.  Patient seen and examined.  

## 2018-04-15 NOTE — Anesthesia Procedure Notes (Signed)
Procedure Name: Intubation Date/Time: 04/15/2018 7:29 AM Performed by: Justus Memory, CRNA Pre-anesthesia Checklist: Patient identified, Patient being monitored, Timeout performed, Emergency Drugs available and Suction available Patient Re-evaluated:Patient Re-evaluated prior to induction Oxygen Delivery Method: Circle system utilized Preoxygenation: Pre-oxygenation with 100% oxygen Induction Type: IV induction Ventilation: Mask ventilation without difficulty Laryngoscope Size: Mac and 3 Grade View: Grade II Tube type: Oral Tube size: 7.0 mm Number of attempts: 1 Airway Equipment and Method: Stylet Placement Confirmation: ETT inserted through vocal cords under direct vision,  positive ETCO2 and breath sounds checked- equal and bilateral Secured at: 22 cm Tube secured with: Tape Dental Injury: Teeth and Oropharynx as per pre-operative assessment

## 2018-04-15 NOTE — Anesthesia Preprocedure Evaluation (Signed)
Anesthesia Evaluation  Patient identified by MRN, date of birth, ID band Patient awake    Reviewed: Allergy & Precautions, H&P , NPO status , reviewed documented beta blocker date and time   History of Anesthesia Complications (+) Family history of anesthesia reaction and history of anesthetic complications  Airway Mallampati: II  TM Distance: >3 FB Neck ROM: full    Dental  (+) Teeth Intact   Pulmonary    Pulmonary exam normal        Cardiovascular hypertension, Normal cardiovascular exam     Neuro/Psych  Headaches, PSYCHIATRIC DISORDERS Anxiety Depression    GI/Hepatic GERD  Medicated and Controlled,  Endo/Other  Morbid obesity  Renal/GU      Musculoskeletal   Abdominal   Peds  Hematology   Anesthesia Other Findings Past Medical History: No date: Allergy No date: Anxiety No date: Depression No date: Family history of adverse reaction to anesthesia     Comment:  MOM-HARD TO WAKE UP No date: Headache     Comment:  MIGRAINES No date: History of kidney stones     Comment:  H/O 2009: History of methicillin resistant staphylococcus aureus (MRSA) No date: Hypertension Past Surgical History: No date: CHOLECYSTECTOMY BMI    Body Mass Index:  45.69 kg/m     Reproductive/Obstetrics                             Anesthesia Physical Anesthesia Plan  ASA: III  Anesthesia Plan: General ETT   Post-op Pain Management:    Induction: Intravenous  PONV Risk Score and Plan: 4 or greater and Ondansetron, Treatment may vary due to age or medical condition, Midazolam, Metaclopromide and Dexamethasone  Airway Management Planned:   Additional Equipment:   Intra-op Plan:   Post-operative Plan: Extubation in OR  Informed Consent: I have reviewed the patients History and Physical, chart, labs and discussed the procedure including the risks, benefits and alternatives for the proposed  anesthesia with the patient or authorized representative who has indicated his/her understanding and acceptance.   Dental Advisory Given  Plan Discussed with: CRNA  Anesthesia Plan Comments:         Anesthesia Quick Evaluation

## 2018-04-15 NOTE — Anesthesia Post-op Follow-up Note (Signed)
Anesthesia QCDR form completed.        

## 2018-04-16 DIAGNOSIS — D110 Benign neoplasm of parotid gland: Secondary | ICD-10-CM | POA: Diagnosis not present

## 2018-04-16 LAB — SURGICAL PATHOLOGY

## 2018-04-16 MED ORDER — SENNOSIDES-DOCUSATE SODIUM 8.6-50 MG PO TABS: 1.0000 | ORAL_TABLET | Freq: Every evening | ORAL | 0 refills | Status: DC | PRN

## 2018-04-16 MED ORDER — AMOXICILLIN-POT CLAVULANATE 875-125 MG PO TABS: 1.0000 | ORAL_TABLET | Freq: Two times a day (BID) | ORAL | 0 refills | Status: DC

## 2018-04-16 MED ORDER — BACITRACIN ZINC 500 UNIT/GM EX OINT: 1.0000 "application " | TOPICAL_OINTMENT | Freq: Three times a day (TID) | CUTANEOUS | 0 refills | Status: DC

## 2018-04-16 MED ORDER — OFLOXACIN 0.3 % OT SOLN: 5.0000 [drp] | Freq: Two times a day (BID) | OTIC | 0 refills | Status: AC

## 2018-04-16 MED ORDER — HYDROCODONE-ACETAMINOPHEN 5-325 MG PO TABS: 1.0000 | ORAL_TABLET | ORAL | 0 refills | Status: DC | PRN

## 2018-04-16 MED ORDER — ONDANSETRON HCL 4 MG PO TABS: 4.0000 mg | ORAL_TABLET | Freq: Three times a day (TID) | ORAL | 0 refills | Status: DC | PRN

## 2018-04-16 NOTE — Progress Notes (Signed)
   04/16/18 0940  Clinical Encounter Type  Visited With Patient and family together  Visit Type Initial   Initial visit to pt during rounds.  2 family members and nurse present.  Pt in good spirits, discharge scheduled for later today.

## 2018-04-16 NOTE — Progress Notes (Signed)
Discharge order received. Patient is alert and oriented. Vital signs stable . No signs of acute distress. Discharge instructions given. Patient verbalized understanding. No other issues noted at this time.   

## 2018-04-16 NOTE — Final Progress Note (Signed)
..   04/16/2018 7:59 AM  [name] [id number]  Post-Op Day 1    Temp:  [97.2 F (36.2 C)-98 F (36.7 C)] 97.9 F (36.6 C) (06/18 0514) Pulse Rate:  [81-115] 81 (06/18 0514) Resp:  [12-27] 18 (06/18 0514) BP: (106-144)/(51-81) 106/59 (06/18 0514) SpO2:  [93 %-99 %] 96 % (06/18 0514) Weight:  [127 kg (280 lb)] 127 kg (280 lb) (06/17 1901),     Intake/Output Summary (Last 24 hours) at 04/16/2018 0759 Last data filed at 04/16/2018 0500 Gross per 24 hour  Intake 680 ml  Output 322.5 ml  Net 357.5 ml    No results found for this or any previous visit (from the past 24 hour(s)).  SUBJECTIVE:  No acute events.  Tolerating diet.  Pain controlled with oral medicaions  OBJECTIVE:  GEN-  NAD NECK-  Right incision c/d/i with expected edema.  Drain removed.  IMPRESSION:  S/p right superficial parotidectomy POD#1  PLAN:  Follow up on 04/22/2018 for suture removal.  Ice for 24 hours.  Limit activity for 2 weeks.  Sleep with head of bed elevated.    Hunt Zajicek 04/16/2018, 7:59 AM

## 2018-05-09 ENCOUNTER — Other Ambulatory Visit: Payer: Self-pay | Admitting: Family Medicine

## 2018-05-09 DIAGNOSIS — F419 Anxiety disorder, unspecified: Principal | ICD-10-CM

## 2018-05-09 DIAGNOSIS — F329 Major depressive disorder, single episode, unspecified: Secondary | ICD-10-CM

## 2018-05-09 DIAGNOSIS — F32A Depression, unspecified: Secondary | ICD-10-CM

## 2018-05-15 ENCOUNTER — Ambulatory Visit: Payer: BC Managed Care – PPO | Admitting: Family Medicine

## 2018-05-24 ENCOUNTER — Encounter: Payer: Self-pay | Admitting: Family Medicine

## 2018-05-24 ENCOUNTER — Ambulatory Visit: Payer: BC Managed Care – PPO | Admitting: Family Medicine

## 2018-05-24 ENCOUNTER — Other Ambulatory Visit: Payer: Self-pay

## 2018-05-24 VITALS — BP 138/72 | HR 102 | Temp 98.9°F | Resp 16 | Ht 66.54 in | Wt 279.6 lb

## 2018-05-24 DIAGNOSIS — R945 Abnormal results of liver function studies: Secondary | ICD-10-CM

## 2018-05-24 DIAGNOSIS — E7439 Other disorders of intestinal carbohydrate absorption: Secondary | ICD-10-CM

## 2018-05-24 DIAGNOSIS — F419 Anxiety disorder, unspecified: Secondary | ICD-10-CM

## 2018-05-24 DIAGNOSIS — Z9889 Other specified postprocedural states: Secondary | ICD-10-CM

## 2018-05-24 DIAGNOSIS — I10 Essential (primary) hypertension: Secondary | ICD-10-CM | POA: Diagnosis not present

## 2018-05-24 DIAGNOSIS — N3941 Urge incontinence: Secondary | ICD-10-CM

## 2018-05-24 DIAGNOSIS — E7800 Pure hypercholesterolemia, unspecified: Secondary | ICD-10-CM

## 2018-05-24 DIAGNOSIS — F329 Major depressive disorder, single episode, unspecified: Secondary | ICD-10-CM

## 2018-05-24 DIAGNOSIS — R7989 Other specified abnormal findings of blood chemistry: Secondary | ICD-10-CM

## 2018-05-24 MED ORDER — SERTRALINE HCL 100 MG PO TABS: 200.0000 mg | ORAL_TABLET | Freq: Every day | ORAL | 1 refills | Status: AC

## 2018-05-24 MED ORDER — AMLODIPINE BESYLATE 5 MG PO TABS: 5.0000 mg | ORAL_TABLET | Freq: Every day | ORAL | 1 refills | Status: AC

## 2018-05-24 MED ORDER — METOPROLOL SUCCINATE ER 50 MG PO TB24: ORAL_TABLET | ORAL | 1 refills | Status: AC

## 2018-05-24 MED ORDER — LIRAGLUTIDE -WEIGHT MANAGEMENT 18 MG/3ML ~~LOC~~ SOPN: 1.2000 mg | PEN_INJECTOR | Freq: Every day | SUBCUTANEOUS | 11 refills | Status: DC

## 2018-05-24 NOTE — Patient Instructions (Addendum)
   IF you received an x-ray today, you will receive an invoice from [facility]. Please contact [facility] at [phone] with questions or concerns regarding your invoice.   IF you received labwork today, you will receive an invoice from [facility]. Please contact [facility] at [phone] with questions or concerns regarding your invoice.   Our billing staff will not be able to assist you with questions regarding bills from these companies.  You will be contacted with the lab results as soon as they are available. The fastest way to get your results is to activate your My Chart account. Instructions are located on the last page of this paperwork. If you have not heard from us regarding the results in 2 weeks, please contact this office.     Calorie Counting for Weight Loss Calories are units of energy. Your body needs a certain amount of calories from food to keep you going throughout the day. When you eat more calories than your body needs, your body stores the extra calories as fat. When you eat fewer calories than your body needs, your body burns fat to get the energy it needs. Calorie counting means keeping track of how many calories you eat and drink each day. Calorie counting can be helpful if you need to lose weight. If you make sure to eat fewer calories than your body needs, you should lose weight. Ask your health care provider what a healthy weight is for you. For calorie counting to work, you will need to eat the right number of calories in a day in order to lose a healthy amount of weight per week. A dietitian can help you determine how many calories you need in a day and will give you suggestions on how to reach your calorie goal.  A healthy amount of weight to lose per week is usually 1-2 lb (0.5-0.9 kg). This usually means that your daily calorie intake should be reduced by 500-750 calories.  Eating 1,200 - 1,500 calories per day can help most women lose  weight.  Eating 1,500 - 1,800 calories per day can help most men lose weight. What is my plan? My goal is to have __________ calories per day. If I have this many calories per day, I should lose around __________ pounds per week. What do I need to know about calorie counting? In order to meet your daily calorie goal, you will need to:  Find out how many calories are in each food you would like to eat. Try to do this before you eat.  Decide how much of the food you plan to eat.  Write down what you ate and how many calories it had. Doing this is called keeping a food log. To successfully lose weight, it is important to balance calorie counting with a healthy lifestyle that includes regular activity. Aim for 150 minutes of moderate exercise (such as walking) or 75 minutes of vigorous exercise (such as running) each week. Where do I find calorie information?   The number of calories in a food can be found on a Nutrition Facts label. If a food does not have a Nutrition Facts label, try to look up the calories online or ask your dietitian for help. Remember that calories are listed per serving. If you choose to have more than one serving of a food, you will have to multiply the calories per serving by the amount of servings you plan to eat. For example, the label on a package of   bread might say that a serving size is 1 slice and that there are 90 calories in a serving. If you eat 1 slice, you will have eaten 90 calories. If you eat 2 slices, you will have eaten 180 calories. How do I keep a food log? Immediately after each meal, record the following information in your food log:  What you ate. Don't forget to include toppings, sauces, and other extras on the food.  How much you ate. This can be measured in cups, ounces, or number of items.  How many calories each food and drink had.  The total number of calories in the meal. Keep your food log near you, such as in a small notebook in your  pocket, or use a mobile app or website. Some programs will calculate calories for you and show you how many calories you have left for the day to meet your goal. What are some calorie counting tips?  Use your calories on foods and drinks that will fill you up and not leave you hungry:  Some examples of foods that fill you up are nuts and nut butters, vegetables, lean proteins, and high-fiber foods like whole grains. High-fiber foods are foods with more than 5 g fiber per serving.  Drinks such as sodas, specialty coffee drinks, alcohol, and juices have a lot of calories, yet do not fill you up.  Eat nutritious foods and avoid empty calories. Empty calories are calories you get from foods or beverages that do not have many vitamins or protein, such as candy, sweets, and soda. It is better to have a nutritious high-calorie food (such as an avocado) than a food with few nutrients (such as a bag of chips).  Know how many calories are in the foods you eat most often. This will help you calculate calorie counts faster.  Pay attention to calories in drinks. Low-calorie drinks include water and unsweetened drinks.  Pay attention to nutrition labels for "low fat" or "fat free" foods. These foods sometimes have the same amount of calories or more calories than the full fat versions. They also often have added sugar, starch, or salt, to make up for flavor that was removed with the fat.  Find a way of tracking calories that works for you. Get creative. Try different apps or programs if writing down calories does not work for you. What are some portion control tips?  Know how many calories are in a serving. This will help you know how many servings of a certain food you can have.  Use a measuring cup to measure serving sizes. You could also try weighing out portions on a kitchen scale. With time, you will be able to estimate serving sizes for some foods.  Take some time to put servings of different foods  on your favorite plates, bowls, and cups so you know what a serving looks like.  Try not to eat straight from a bag or box. Doing this can lead to overeating. Put the amount you would like to eat in a cup or on a plate to make sure you are eating the right portion.  Use smaller plates, glasses, and bowls to prevent overeating.  Try not to multitask (for example, watch TV or use your computer) while eating. If it is time to eat, sit down at a table and enjoy your food. This will help you to know when you are full. It will also help you to be aware of what you are eating and   how much you are eating. What are tips for following this plan? Reading food labels   Check the calorie count compared to the serving size. The serving size may be smaller than what you are used to eating.  Check the source of the calories. Make sure the food you are eating is high in vitamins and protein and low in saturated and trans fats. Shopping   Read nutrition labels while you shop. This will help you make healthy decisions before you decide to purchase your food.  Make a grocery list and stick to it. Cooking   Try to cook your favorite foods in a healthier way. For example, try baking instead of frying.  Use low-fat dairy products. Meal planning   Use more fruits and vegetables. Half of your plate should be fruits and vegetables.  Include lean proteins like poultry and fish. How do I count calories when eating out?  Ask for smaller portion sizes.  Consider sharing an entree and sides instead of getting your own entree.  If you get your own entree, eat only half. Ask for a box at the beginning of your meal and put the rest of your entree in it so you are not tempted to eat it.  If calories are listed on the menu, choose the lower calorie options.  Choose dishes that include vegetables, fruits, whole grains, low-fat dairy products, and lean protein.  Choose items that are boiled, broiled, grilled, or  steamed. Stay away from items that are buttered, battered, fried, or served with cream sauce. Items labeled "crispy" are usually fried, unless stated otherwise.  Choose water, low-fat milk, unsweetened iced tea, or other drinks without added sugar. If you want an alcoholic beverage, choose a lower calorie option such as a glass of wine or light beer.  Ask for dressings, sauces, and syrups on the side. These are usually high in calories, so you should limit the amount you eat.  If you want a salad, choose a garden salad and ask for grilled meats. Avoid extra toppings like bacon, cheese, or fried items. Ask for the dressing on the side, or ask for olive oil and vinegar or lemon to use as dressing.  Estimate how many servings of a food you are given. For example, a serving of cooked rice is  cup or about the size of half a baseball. Knowing serving sizes will help you be aware of how much food you are eating at restaurants. The list below tells you how big or small some common portion sizes are based on everyday objects:  1 oz-4 stacked dice.  3 oz-1 deck of cards.  1 tsp-1 die.  1 Tbsp- a ping-pong ball.  2 Tbsp-1 ping-pong ball.   cup- baseball.  1 cup-1 baseball. Summary  Calorie counting means keeping track of how many calories you eat and drink each day. If you eat fewer calories than your body needs, you should lose weight.  A healthy amount of weight to lose per week is usually 1-2 lb (0.5-0.9 kg). This usually means reducing your daily calorie intake by 500-750 calories.  The number of calories in a food can be found on a Nutrition Facts label. If a food does not have a Nutrition Facts label, try to look up the calories online or ask your dietitian for help.  Use your calories on foods and drinks that will fill you up, and not on foods and drinks that will leave you hungry.  Use smaller plates, glasses,   bowls to prevent overeating. This information is not intended to  replace advice given to you by your health care provider. Make sure you discuss any questions you have with your health care provider. Document Released: 10/16/2005 Document Revised: 09/15/2016 Document Reviewed: 09/15/2016 Elsevier Interactive Patient Education  [employer].

## 2018-05-24 NOTE — Progress Notes (Signed)
Subjective:    Patient ID: [name], female    DOB: [birth year], 42 y.o.   MRN: [id number]  05/24/2018  Weight Loss (2 month follow-up for weight loss )    HPI This 42 y.o. female presents for evaluation of HYPERTENSION,HYPOTHYROIDISM, GLUCOSE INTOLERANCE, ANXIETY/DEPRESSION. S/p parotidectomy; benign pathology.  Surgery interfered with weight loss attempts. Started medication again; started exercising this week.  Second day made through the whole day.  Fourteen day free trial.   Increased dose of Saxenda 1.2mg  but started vomiting with diarrhea.   Starting school back in three weeks.  Taking Edison Nasuti to college at [employer].    Stopped drinking sodas.   Frustrated because first three weeks of summer. Urge incontinence greatly affecting quality of life.  Specifically, incontinence is interfering with intimacy with husband.  Agreeable to urology referral.  Cannot afford bladder agents.  BP Readings from Last 3 Encounters:  05/24/18 138/72  04/16/18 (!) 106/59  03/18/18 118/86   Wt Readings from Last 3 Encounters:  05/24/18 279 lb 9.6 oz (126.8 kg)  04/15/18 280 lb (127 kg)  04/09/18 282 lb (127.9 kg)   Immunization History  Administered Date(s) Administered  . PPD Test 01/05/2017  . Td 02/27/1997  . Tdap 10/30/2004    Review of Systems  Constitutional: Negative for activity change, appetite change, chills, diaphoresis, fatigue, fever and unexpected weight change.  HENT: Negative for congestion, dental problem, drooling, ear discharge, ear pain, facial swelling, hearing loss, mouth sores, nosebleeds, postnasal drip, rhinorrhea, sinus pressure, sneezing, sore throat, tinnitus, trouble swallowing and voice change.   Eyes: Negative for photophobia, pain, discharge, redness, itching and visual disturbance.  Respiratory: Negative for apnea, cough, choking, chest tightness, shortness of breath, wheezing and stridor.   Cardiovascular: Negative for chest pain, palpitations and  leg swelling.  Gastrointestinal: Negative for abdominal distention, abdominal pain, anal bleeding, blood in stool, constipation, diarrhea, nausea, rectal pain and vomiting.  Endocrine: Negative for cold intolerance, heat intolerance, polydipsia, polyphagia and polyuria.  Genitourinary: Positive for frequency and urgency. Negative for decreased urine volume, difficulty urinating, dyspareunia, dysuria, enuresis, flank pain, genital sores, hematuria, menstrual problem, pelvic pain, vaginal bleeding, vaginal discharge and vaginal pain.  Musculoskeletal: Negative for arthralgias, back pain, gait problem, joint swelling, myalgias, neck pain and neck stiffness.  Skin: Negative for color change, pallor, rash and wound.  Allergic/Immunologic: Negative for environmental allergies, food allergies and immunocompromised state.  Neurological: Negative for dizziness, tremors, seizures, syncope, facial asymmetry, speech difficulty, weakness, light-headedness, numbness and headaches.  Hematological: Negative for adenopathy. Does not bruise/bleed easily.  Psychiatric/Behavioral: Negative for agitation, behavioral problems, confusion, decreased concentration, dysphoric mood, hallucinations, self-injury, sleep disturbance and suicidal ideas. The patient is not nervous/anxious and is not hyperactive.     Past Medical History:  Diagnosis Date  . Allergy   . Anxiety   . Depression   . Family history of adverse reaction to anesthesia    MOM-HARD TO WAKE UP  . Headache    MIGRAINES  . History of kidney stones    H/O  . History of methicillin resistant staphylococcus aureus (MRSA) 2009  . Hypertension    Past Surgical History:  Procedure Laterality Date  . CHOLECYSTECTOMY    . PAROTIDECTOMY Right 04/15/2018   Procedure: PAROTIDECTOMY;  Surgeon: Carloyn Manner, MD;  Location: [facility] ORS;  Service: ENT;  Laterality: Right;   Allergies  Allergen Reactions  . Sulfur Nausea Only  . Hctz [Hydrochlorothiazide]      Feels bad on this medication/  has headache.   Current Outpatient Medications on File Prior to Visit  Medication Sig Dispense Refill  . levothyroxine (SYNTHROID, LEVOTHROID) 50 MCG tablet TAKE ONE TABLET BY MOUTH DAILY BEFORE BREAKFAST 90 tablet 3   No current facility-administered medications on file prior to visit.    Social History   Socioeconomic History  . Marital status: Married    Spouse name: Not on file  . Number of children: 2  . Years of education: Not on file  . Highest education level: Not on file  Occupational History  . Occupation: Pharmacist, hospital    Comment: Dispensing optician in [place]  . Financial resource strain: Not on file  . Food insecurity:    Worry: Not on file    Inability: Not on file  . Transportation needs:    Medical: Not on file    Non-medical: Not on file  Tobacco Use  . Smoking status: Never Smoker  . Smokeless tobacco: Never Used  Substance and Sexual Activity  . Alcohol use: No  . Drug use: No  . Sexual activity: Yes    Birth control/protection: None  Lifestyle  . Physical activity:    Days per week: Not on file    Minutes per session: Not on file  . Stress: Not on file  Relationships  . Social connections:    Talks on phone: Not on file    Gets together: Not on file    Attends religious service: Not on file    Active member of club or organization: Not on file    Attends meetings of clubs or organizations: Not on file    Relationship status: Not on file  . Intimate partner violence:    Fear of current or ex partner: Not on file    Emotionally abused: Not on file    Physically abused: Not on file    Forced sexual activity: Not on file  Other Topics Concern  . Not on file  Social History Narrative   Marital status: married      Children: two (son 54 yo and daughter 57 yo)      Lives: with two children, husband.      Employment: Pharmacist, hospital at [employer]      Tobacco: none      Alcohol: none        Drugs: none      Exercise: walking sporadically   Family History  Problem Relation Age of Onset  . Diabetes Mother   . Heart disease Mother        CAD with stenting  . Hypertension Mother   . Hyperlipidemia Mother   . Hypothyroidism Mother   . Heart disease Father   . Heart disease Maternal Grandmother   . Stroke Maternal Grandmother   . Heart disease Paternal Grandmother   . Heart disease Paternal Grandfather        Objective:    BP 138/72   Pulse (!) 102   Temp 98.9 F (37.2 C) (Oral)   Resp 16   Ht 5' 6.54" (1.69 m)   Wt 279 lb 9.6 oz (126.8 kg)   SpO2 98%   BMI 44.41 kg/m  Physical Exam  Constitutional: She is oriented to person, place, and time. She appears well-developed and well-nourished. No distress.  HENT:  Head: Normocephalic and atraumatic.  Right Ear: External ear normal.  Left Ear: External ear normal.  Nose: Nose normal.  Mouth/Throat: Oropharynx is clear and moist.  Eyes: Pupils  are equal, round, and reactive to light. Conjunctivae and EOM are normal.  Neck: Normal range of motion. Neck supple. Carotid bruit is not present. No thyromegaly present.  Cardiovascular: Normal rate, regular rhythm, normal heart sounds and intact distal pulses. Exam reveals no gallop and no friction rub.  No murmur heard. Pulmonary/Chest: Effort normal and breath sounds normal. She has no wheezes. She has no rales.  Abdominal: Soft. Bowel sounds are normal. She exhibits no distension and no mass. There is no tenderness. There is no rebound and no guarding.  Lymphadenopathy:    She has no cervical adenopathy.  Neurological: She is alert and oriented to person, place, and time. She displays normal reflexes. No cranial nerve deficit or sensory deficit. She exhibits normal muscle tone. Coordination normal.  Skin: Skin is warm and dry. No rash noted. She is not diaphoretic. No erythema. No pallor.  Psychiatric: She has a normal mood and affect. Her behavior is normal.  Judgment normal.   No results found. Depression screen [facility] 2/9 05/24/2018 03/18/2018 02/16/2018 12/16/2016 03/21/2016  Decreased Interest 0 0 0 0 0  Down, Depressed, Hopeless 0 0 0 0 0  PHQ - 2 Score 0 0 0 0 0   Fall Risk  05/24/2018 03/18/2018 02/16/2018 12/16/2016 03/21/2016  Falls in the past year? No No No No No  Risk for fall due to : - - - - -        Assessment & Plan:   1. Essential hypertension   2. Anxiety and depression   3. Urge incontinence   4. Hx of superficial parotidectomy   5. Glucose intolerance   6. Pure hypercholesterolemia   7. Morbid obesity ([place])     Urge incontinence: Uncontrolled.  Unable to afford many bladder agents.  Patient now agreeable to undergo urology consultation.  Interfering with intimacy with husband.  Patient recognizes that weight loss and exercise will help yet interested in further intervention while attempting weight loss and exercise.  Morbid obesity: Now focusing again on weight loss and exercise.  Refill of Saxenda provided.  Recommend weight loss, exercise for 30-60 minutes five days per week; recommend 1200 kcal restriction per day with a minimum of 60 grams of protein per day.  Eat 3 meals per day. Do not skip meals. Consider having a protein shake as a meal replacement to aid with eliminating meal skipping. Look for products with <220 calories, <7 gm sugar, and 20-30 gm protein.  Eat breakfast within 2 hours of getting up.   Make  your plate non-starchy vegetables,  protein, and  carbohydrates at lunch and dinner.   Aim for at least 64 oz. of calorie-free beverages daily (water, Crystal Light, diet green tea, etc.). Eliminate any sugary beverages such as regular soda, sweet tea, or fruit juice.   Pay attention to hunger and fullness cues.  Stop eating once you feel satisfied; don't wait until you feel full, stuffed, or sick from eating.  Choose lean meats and low fat/fat free dairy products.  Choose foods high in fiber such as  fruits, vegetables, and whole grains (brown rice, whole wheat pasta, whole wheat bread, etc.).  Limit foods with added sugar to <7 gm per serving.  Always eat in the kitchen/dining room.  Never eat in the bedroom or in front of the TV.   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  Doing well on postoperative period.  Hypertension, glucose intolerance, hypercholesterolemia, anxiety/depression: Moderately controlled.  Obtain labs for chronic disease management.  Orders Placed This Encounter  Procedures  . CBC with Differential/Platelet  . Comprehensive metabolic panel    Order Specific Question:   Has the patient fasted?    Answer:   No  . Hemoglobin A1c  . Lipid panel    Order Specific Question:   Has the patient fasted?    Answer:   No  . Ambulatory referral to Urology    Referral Priority:   Routine    Referral Type:   Consultation    Referral Reason:   Specialty Services Required    Requested Specialty:   Urology    Number of Visits Requested:   1   Meds ordered this encounter  Medications  . Liraglutide -Weight Management (SAXENDA) 18 MG/3ML SOPN    Sig: Inject 1.2 mg into the skin daily.    Dispense:  3 mL    Refill:  11  . metoprolol succinate (TOPROL-XL) 50 MG 24 hr tablet    Sig: TAKE ONE TABLET BY MOUTH ONE TIME DAILY (TAKE WITH OR IMMEDIATELY FOLLOWING A MEAL)    Dispense:  90 tablet    Refill:  1  . amLODipine (NORVASC) 5 MG tablet    Sig: Take 1 tablet (5 mg total) by mouth daily.    Dispense:  90 tablet    Refill:  1  . sertraline (ZOLOFT) 100 MG tablet    Sig: Take 2 tablets (200 mg total) by mouth daily.    Dispense:  180 tablet    Refill:  1    Return in about 3 months (around 08/24/2018) for follow-up chronic medical conditions [place].   Gery Sabedra Elayne Guerin, M.D. [facility] [address] [place], [place]  [address] [phone] phone [phone] fax

## 2018-05-25 LAB — CBC WITH DIFFERENTIAL/PLATELET
Basophils Absolute: 0 10*3/uL (ref 0.0–0.2)
Basos: 0 %
EOS (ABSOLUTE): 0.1 10*3/uL (ref 0.0–0.4)
EOS: 1 %
HEMATOCRIT: 38.9 % (ref 34.0–46.6)
HEMOGLOBIN: 12.6 g/dL (ref 11.1–15.9)
Immature Grans (Abs): 0.1 10*3/uL (ref 0.0–0.1)
Immature Granulocytes: 1 %
LYMPHS ABS: 2.8 10*3/uL (ref 0.7–3.1)
Lymphs: 25 %
MCH: 28.2 pg (ref 26.6–33.0)
MCHC: 32.4 g/dL (ref 31.5–35.7)
MCV: 87 fL (ref 79–97)
MONOCYTES: 5 %
MONOS ABS: 0.5 10*3/uL (ref 0.1–0.9)
NEUTROS ABS: 7.6 10*3/uL — AB (ref 1.4–7.0)
Neutrophils: 68 %
Platelets: 355 10*3/uL (ref 150–450)
RBC: 4.47 x10E6/uL (ref 3.77–5.28)
RDW: 12.6 % (ref 12.3–15.4)
WBC: 11.2 10*3/uL — AB (ref 3.4–10.8)

## 2018-05-25 LAB — COMPREHENSIVE METABOLIC PANEL
A/G RATIO: 1.5 (ref 1.2–2.2)
ALBUMIN: 4.3 g/dL (ref 3.5–5.5)
ALK PHOS: 122 IU/L — AB (ref 39–117)
ALT: 71 IU/L — ABNORMAL HIGH (ref 0–32)
AST: 49 IU/L — AB (ref 0–40)
BILIRUBIN TOTAL: 0.2 mg/dL (ref 0.0–1.2)
BUN / CREAT RATIO: 11 (ref 9–23)
BUN: 10 mg/dL (ref 6–24)
CO2: 22 mmol/L (ref 20–29)
Calcium: 9.4 mg/dL (ref 8.7–10.2)
Chloride: 101 mmol/L (ref 96–106)
Creatinine, Ser: 0.91 mg/dL (ref 0.57–1.00)
GFR calc Af Amer: 89 mL/min/{1.73_m2} (ref >59)
GFR calc non Af Amer: 77 mL/min/{1.73_m2} (ref >59)
GLOBULIN, TOTAL: 2.9 g/dL (ref 1.5–4.5)
Glucose: 166 mg/dL — ABNORMAL HIGH (ref 65–99)
POTASSIUM: 4 mmol/L (ref 3.5–5.2)
SODIUM: 138 mmol/L (ref 134–144)
Total Protein: 7.2 g/dL (ref 6.0–8.5)

## 2018-05-25 LAB — LIPID PANEL
Chol/HDL Ratio: 4.6 ratio — ABNORMAL HIGH (ref 0.0–4.4)
Cholesterol, Total: 223 mg/dL — ABNORMAL HIGH (ref 100–199)
HDL: 48 mg/dL (ref >39)
LDL Calculated: 131 mg/dL — ABNORMAL HIGH (ref 0–99)
Triglycerides: 219 mg/dL — ABNORMAL HIGH (ref 0–149)
VLDL Cholesterol Cal: 44 mg/dL — ABNORMAL HIGH (ref 5–40)

## 2018-05-25 LAB — HEMOGLOBIN A1C
ESTIMATED AVERAGE GLUCOSE: 120 mg/dL
HEMOGLOBIN A1C: 5.8 % — AB (ref 4.8–5.6)

## 2018-05-28 DIAGNOSIS — E7800 Pure hypercholesterolemia, unspecified: Secondary | ICD-10-CM | POA: Insufficient documentation

## 2018-05-28 DIAGNOSIS — E7439 Other disorders of intestinal carbohydrate absorption: Secondary | ICD-10-CM | POA: Insufficient documentation

## 2018-06-01 NOTE — Addendum Note (Signed)
Addended by: Wardell Honour on: 06/01/2018 11:10 AM   Modules accepted: Orders

## 2018-06-19 ENCOUNTER — Telehealth: Payer: Self-pay | Admitting: Family Medicine

## 2018-06-19 NOTE — Telephone Encounter (Signed)
Pt's Saxenda 18MG /3ML pen injectors is about to expire  Please advise   Tanya 8.21.19

## 2018-07-03 ENCOUNTER — Telehealth: Payer: Self-pay | Admitting: Family Medicine

## 2018-07-03 NOTE — Telephone Encounter (Signed)
Called patient to let her know that her medication [name]) was approved by her insurance company. Did not leave VM because no dpr on file.

## 2018-07-11 ENCOUNTER — Ambulatory Visit (INDEPENDENT_AMBULATORY_CARE_PROVIDER_SITE_OTHER): Payer: BC Managed Care – PPO | Admitting: Urology

## 2018-07-11 ENCOUNTER — Encounter: Payer: Self-pay | Admitting: Urology

## 2018-07-11 VITALS — BP 144/90 | HR 109 | Ht 68.0 in | Wt 282.0 lb

## 2018-07-11 DIAGNOSIS — N3941 Urge incontinence: Secondary | ICD-10-CM

## 2018-07-11 DIAGNOSIS — N393 Stress incontinence (female) (male): Secondary | ICD-10-CM | POA: Diagnosis not present

## 2018-07-11 LAB — URINALYSIS, COMPLETE
Bilirubin, UA: NEGATIVE
Ketones, UA: NEGATIVE
LEUKOCYTES UA: NEGATIVE
NITRITE UA: NEGATIVE
PROTEIN UA: NEGATIVE
SPEC GRAV UA: 1.02 (ref 1.005–1.030)
Urobilinogen, Ur: 0.2 mg/dL (ref 0.2–1.0)
pH, UA: 7 (ref 5.0–7.5)

## 2018-07-11 LAB — MICROSCOPIC EXAMINATION: WBC, UA: NONE SEEN /hpf (ref 0–5)

## 2018-07-11 LAB — BLADDER SCAN AMB NON-IMAGING

## 2018-07-11 MED ORDER — MIRABEGRON ER 50 MG PO TB24: 50.0000 mg | ORAL_TABLET | Freq: Every day | ORAL | 11 refills | Status: AC

## 2018-07-11 NOTE — Patient Instructions (Signed)

## 2018-07-11 NOTE — Progress Notes (Signed)
07/11/2018 10:56 AM   [name] 1976-01-13 [id number]  Referring provider: Wardell Honour, MD [address] [place],  [address]  Chief Complaint  Patient presents with  . Urinary Incontinence    New patient    HPI: 42 year old female who presents today to establish care for treatment of urinary incontinence.  She reports that over the past year, her urinary issues have gotten much worse.  They are now affecting her her personal life significantly.  She wears approximately 4 pads a day which are 50% saturated.  Additionally, she is been avoiding sexual intercourse with her husband due to urinary leakage which is extremely upsetting to her.  She reports that she has leakage when she laughs coughs and sneezes.  This is been going on but progressed over the past several years.  She had 2 vaginal deliveries in the past.  She is obese but has not gained any recent weight or lost weight.  She has tried pelvic floor exercises in the remote past but did not think that this helped much.  No previous physical therapy.  She denies any vaginal bulging or pain with intercourse.  Additionally, she has severe overactivity.  She goes to the bathroom sometimes several times an hour during the daytime.  She also occasionally gets up at night to pee.  She also has rare urge incontinence when she cannot get her pants down in time.  She does engage in toilet mapping.  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  She does drink one soda in the morning but otherwise avoids coffee or tea.  She was prescribed medication by her primary care physician but cannot remember the name of this medication.  When she went to pick up the medication, was greater than $150 per month and thus never filled the prescription.  She now has new insurance with better coverage.  She is prediabetic.  No UTIs or gross hematuria.  PMH: Past Medical History:  Diagnosis Date  .  Allergy   . Anxiety   . Depression   . Family history of adverse reaction to anesthesia    MOM-HARD TO WAKE UP  . Headache    MIGRAINES  . History of kidney stones    H/O  . History of methicillin resistant staphylococcus aureus (MRSA) 2009  . Hypertension     Surgical History: Past Surgical History:  Procedure Laterality Date  . CHOLECYSTECTOMY    . PAROTIDECTOMY Right 04/15/2018   Procedure: PAROTIDECTOMY;  Surgeon: Carloyn Manner, MD;  Location: [facility] ORS;  Service: ENT;  Laterality: Right;    Home Medications:  Allergies as of 07/11/2018      Reactions   Sulfur Nausea Only   Hctz [hydrochlorothiazide]    Feels bad on this medication/ has headache.      Medication List        Accurate as of 07/11/18 10:56 AM. Always use your most recent med list.          amLODipine 5 MG tablet Commonly known as:  NORVASC Take 1 tablet (5 mg total) by mouth daily.   metoprolol succinate 50 MG 24 hr tablet Commonly known as:  TOPROL-XL TAKE ONE TABLET BY MOUTH ONE TIME DAILY (TAKE WITH OR IMMEDIATELY FOLLOWING A MEAL)   mirabegron ER 50 MG Tb24 tablet Commonly known as:  MYRBETRIQ Take 1 tablet (50 mg total) by mouth daily.   sertraline 100 MG tablet Commonly  known as:  ZOLOFT Take 2 tablets (200 mg total) by mouth daily.       Allergies:  Allergies  Allergen Reactions  . Sulfur Nausea Only  . Hctz [Hydrochlorothiazide]     Feels bad on this medication/ has headache.    Family History: Family History  Problem Relation Age of Onset  . Diabetes Mother   . Heart disease Mother        CAD with stenting  . Hypertension Mother   . Hyperlipidemia Mother   . Hypothyroidism Mother   . Heart disease Father   . Heart disease Maternal Grandmother   . Stroke Maternal Grandmother   . Heart disease Paternal Grandmother   . Heart disease Paternal Grandfather     Social History:  reports that she has never smoked. She has never used smokeless tobacco. She reports that  she does not drink alcohol or use drugs.  ROS: UROLOGY Frequent Urination?: Yes Hard to postpone urination?: Yes Burning/pain with urination?: No Get up at night to urinate?: Yes Leakage of urine?: Yes Urine stream starts and stops?: No Trouble starting stream?: No Do you have to strain to urinate?: No Blood in urine?: No Urinary tract infection?: No Sexually transmitted disease?: No Injury to kidneys or bladder?: No Painful intercourse?: No Weak stream?: No Currently pregnant?: No Vaginal bleeding?: No Last menstrual period?: n  Gastrointestinal Nausea?: No Vomiting?: No Indigestion/heartburn?: No Diarrhea?: No Constipation?: No  Constitutional Fever: No Night sweats?: No Weight loss?: No Fatigue?: No  Skin Skin rash/lesions?: No Itching?: No  Eyes Blurred vision?: No Double vision?: No  Ears/Nose/Throat Sore throat?: No Sinus problems?: No  Hematologic/Lymphatic Swollen glands?: No Easy bruising?: No  Cardiovascular Leg swelling?: No Chest pain?: No  Respiratory Cough?: No Shortness of breath?: No  Endocrine Excessive thirst?: No  Musculoskeletal Back pain?: No Joint pain?: No  Neurological Headaches?: No Dizziness?: No  Psychologic Depression?: No Anxiety?: No  Physical Exam: BP (!) 144/90   Pulse (!) 109   Ht 5\' 8"  (1.727 m)   Wt 282 lb (127.9 kg)   BMI 42.88 kg/m  Constitutional:  Alert and oriented, No acute distress. HEENT: [place] AT, moist mucus membranes.  Trachea midline, no masses. Cardiovascular: No clubbing, cyanosis, or edema. Respiratory: Normal respiratory effort, no increased work of breathing. GI: Abdomen is soft, nontender, nondistended, no abdominal masses, obese GU: Recommended pelvic exam today, patient declined Skin: No rashes, bruises or suspicious lesions. Neurologic: Grossly intact, no focal deficits, moving all 4 extremities. Psychiatric: Normal mood and affect.  Laboratory Data: Lab Results  Component  Value Date   WBC 11.2 (H) 05/24/2018   HGB 12.6 05/24/2018   HCT 38.9 05/24/2018   MCV 87 05/24/2018   PLT 355 05/24/2018    Lab Results  Component Value Date   CREATININE 0.91 05/24/2018    Lab Results  Component Value Date   HGBA1C 5.8 (H) 05/24/2018    Urinalysis/ Pertinent Imaging: Results for orders placed or performed in visit on 07/11/18  Microscopic Examination  Result Value Ref Range   WBC, UA None seen 0 - 5 /hpf   RBC, UA 0-2 0 - 2 /hpf   Epithelial Cells (non renal) 0-10 0 - 10 /hpf   Crystals Present (A) N/A   Crystal Type Calcium Oxalate N/A   Mucus, UA Present (A) Not Estab.   Bacteria, UA Moderate (A) None seen/Few  Urinalysis, Complete  Result Value Ref Range   Specific Gravity, UA 1.020 1.005 - 1.030  pH, UA 7.0 5.0 - 7.5   Color, UA Yellow Yellow   Appearance Ur Clear Clear   Leukocytes, UA Negative Negative   Protein, UA Negative Negative/Trace   Glucose, UA 1+ (A) Negative   Ketones, UA Negative Negative   RBC, UA Trace (A) Negative   Bilirubin, UA Negative Negative   Urobilinogen, Ur 0.2 0.2 - 1.0 mg/dL   Nitrite, UA Negative Negative   Microscopic Examination See below:   BLADDER SCAN AMB NON-IMAGING  Result Value Ref Range   Scan Result 24ml      Assessment & Plan: 42 year old female with severe mixed urinary incontinence, poorly controlled.  Lengthy discussion today educating the patient on the different types of urinary incontinence as well as treatment options for each.  1. Urge incontinence We discussed my behavioral modification at length today including weight loss and control of her prediabetes Recommend cutting out soda in the morning as this is irritating to the bladder She was given samples of Myrbetriq 25 mg x 1 month, as well as Myrbetriq 50 mg and told to titrate up the dose after 1 month pending the results.  I did go ahead and prescribe Myrbetriq 50 to her pharmacy.  She will be in communication with [place] either by telephone  or my chart to let us know how its going. - Urinalysis, Complete - BLADDER SCAN AMB NON-IMAGING  2. Stress incontinence, female Really recommend lifestyle modification with weight loss and pelvic floor exercises We did discuss referral to physical therapy to learn appropriate pelvic floor exercises to jumpstart her rehabilitation We also briefly reviewed the role of surgery and treatment of stress incontinence, would encourage her to follow-up with my partner in 3 months to reassess - Ambulatory referral to Physical Therapy   Hollice Espy, MD   Return in about 3 months (around 10/10/2018) for Dr. Matilde Sprang or me.  Hollice Espy, MD  [facility] [address], [place], [place] [address] [phone]

## 2018-10-21 ENCOUNTER — Ambulatory Visit: Payer: BC Managed Care – PPO | Admitting: Urology

## 2019-02-10 IMAGING — CT CT NECK W/ CM
4 of 6 series · 13 of 33 positions shown, 15 images · IV contrast (omnipaque)
Comparison: None.

CLINICAL DATA: RIGHT-sided neck mass since [place]. Nonpainful.
Treated with antibiotics.

EXAM:
CT NECK WITH CONTRAST
TECHNIQUE: Multidetector CT imaging of the neck was performed using the
standard protocol following the bolus administration of intravenous
contrast.
CONTRAST:  75mL OMNIPAQUE IOHEXOL 300 MG/ML  SOLN

[Series 3: axial bone neck · axial · 0.57mm/px · z∈[-636,-552]mm · 2 of 128 slices shown]
[im 43/128  bone]
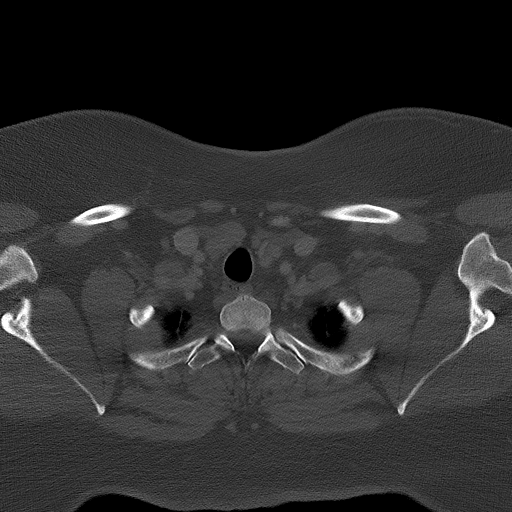
[im 85/128  bone]
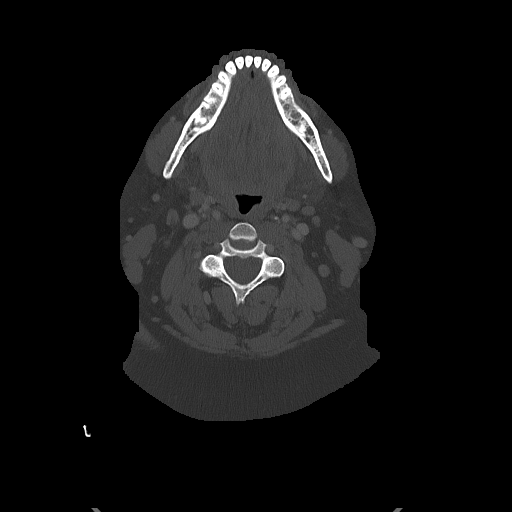

[Series 5: coronal neck neck (person_name) · coronal · 0.57mm/px · 3 of 136 slices shown]
[im 46/136  bone]
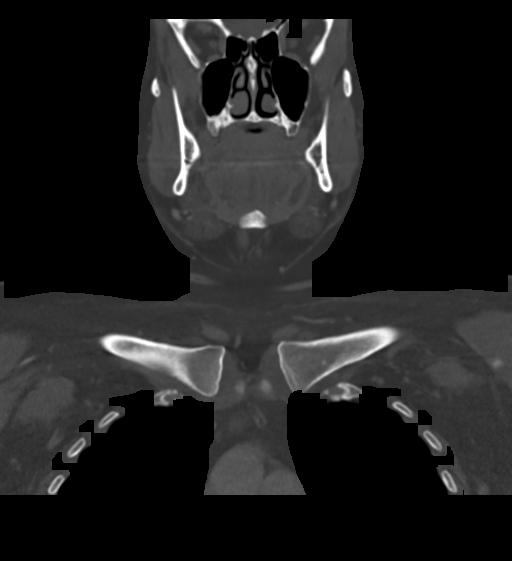
[im 61/136  bone]
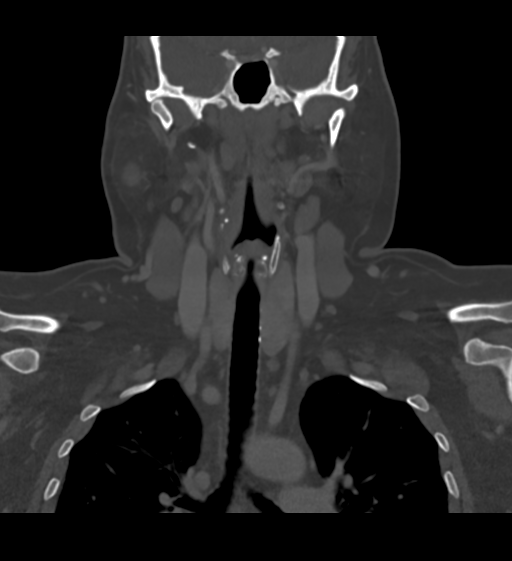
[im 76/136  bone]
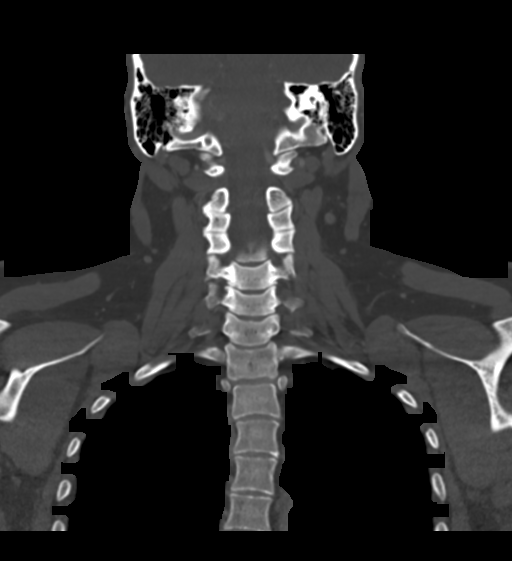

[Series 7: sagittal neck neck (person_name) · sagittal · 0.53mm/px · 5 of 146 slices shown, 6 images]
[im 49/146  bone]
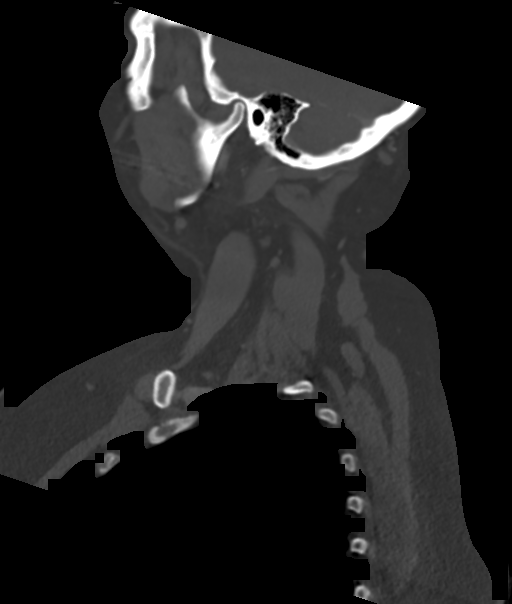
[im 61/146  bone]
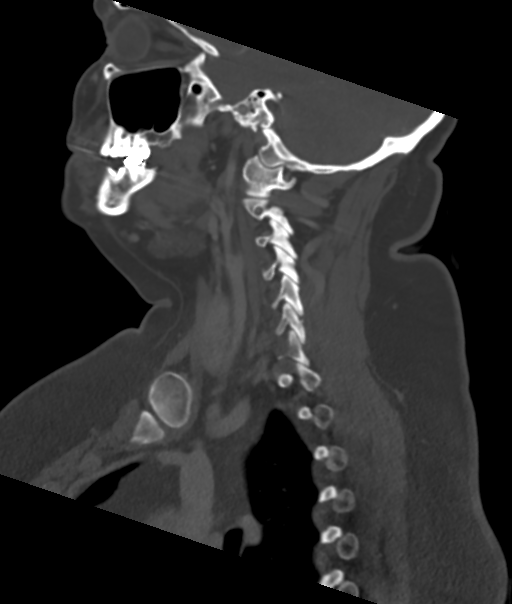
[im 73/146  soft-tissue]
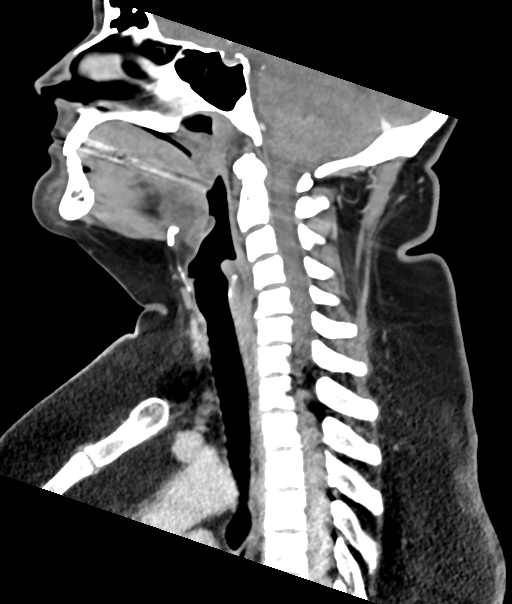
[im 73/146  bone]
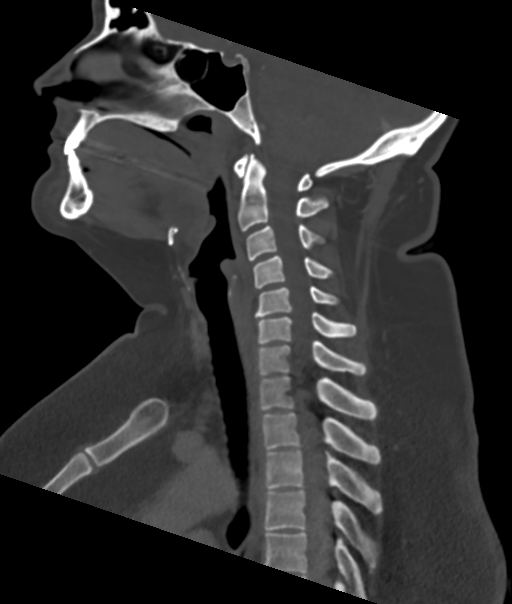
[im 85/146  bone]
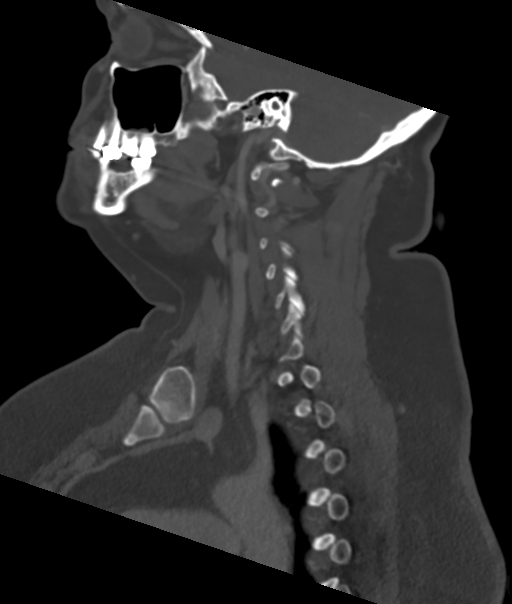
[im 97/146  bone]
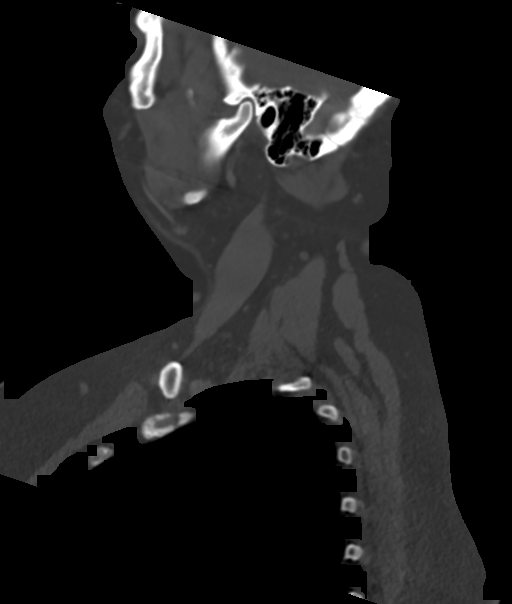

[Series 9: ax oropharynx neck neck (person_name) · axial · 0.53mm/px · z∈[-712,-560]mm · 3 of 160 slices shown, 4 images]
[im 40/160  soft-tissue]
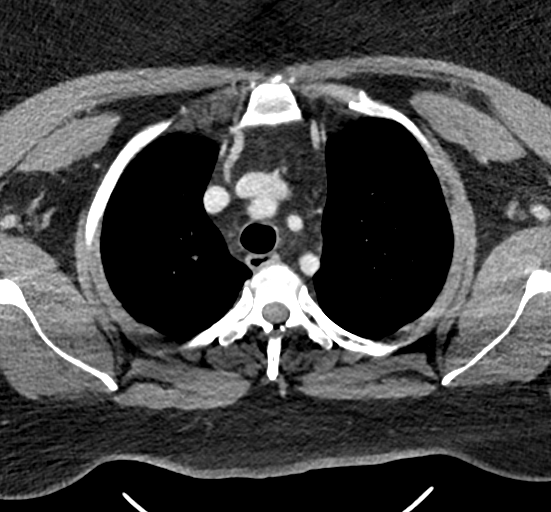
[im 40/160  bone]
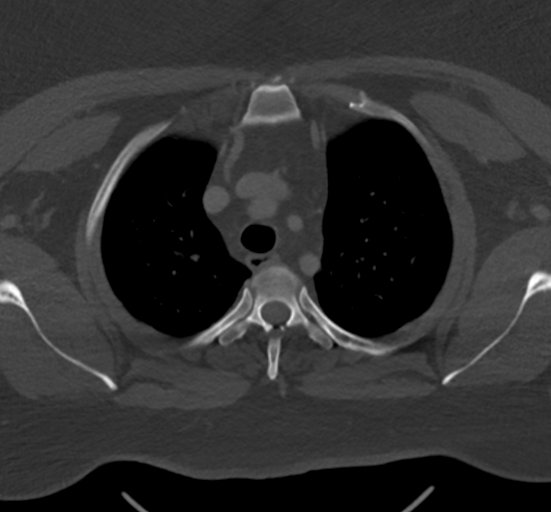
[im 80/160  bone]
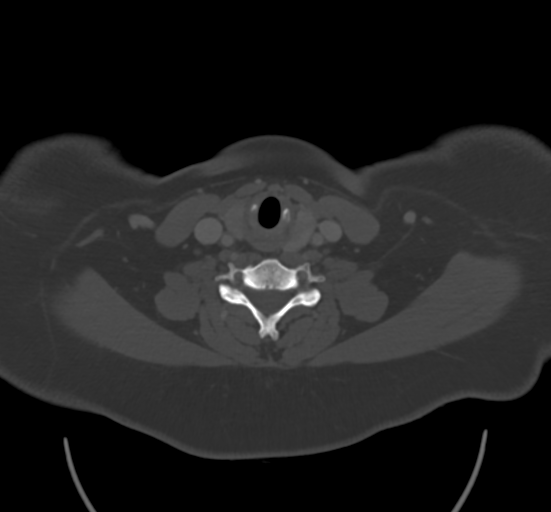
[im 120/160  bone]
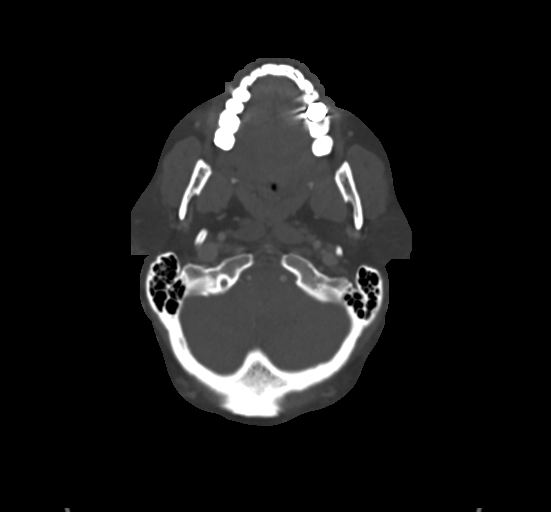

[13 of 33 positions shown; findings below may reference images not displayed]

FINDINGS: Pharynx and larynx: Normal. No mass or swelling.

Salivary glands: 15 x 16 x 19 mm hypodense mass lesion of the
superficial lobe of the RIGHT parotid, with a suggestion of
peripherally enhancing capsule. No similar lesions on the LEFT.
Submandibular glands are normal. No salivary gland stones or
inflammation.

Thyroid: Normal.

Lymph nodes: None enlarged or abnormal density.

Vascular: Minor calcification at the LEFT carotid bifurcation.
Visible vessels are patent.

Limited intracranial: Negative.

Visualized orbits: Negative.

Mastoids and visualized paranasal sinuses: Clear

Skeleton: Spondylosis.  No worrisome osseous lesion.

Upper chest: Negative.

Other: None.
IMPRESSION: 15 x 16 x 19 mm hypodense mass of the superficial lobe RIGHT parotid
gland corresponds to the area of clinical concern. Pleomorphic
adenoma is statistically most likely, but benign and malignant
processes could have this appearance. Tissue sampling is warranted
to assess the nature of what is most likely a neoplasm.

## 2020-01-03 ENCOUNTER — Ambulatory Visit: Payer: BC Managed Care – PPO | Attending: Internal Medicine

## 2020-01-03 DIAGNOSIS — Z23 Encounter for immunization: Secondary | ICD-10-CM

## 2020-01-03 NOTE — Progress Notes (Signed)
   Covid-19 Vaccination Clinic  Name:  [name]    MRN: [id number] DOB: [birth year]  01/03/2020  [name] was observed post Covid-19 immunization for 15 minutes without incident. She was provided with Vaccine Information Sheet and instruction to access the V-Safe system.   [name] was instructed to call 911 with any severe reactions post vaccine: Marland Kitchen Difficulty breathing  . Swelling of face and throat  . A fast heartbeat  . A bad rash all over body  . Dizziness and weakness   Immunizations Administered    Name Date Dose VIS Date Route   Pfizer COVID-19 Vaccine 01/03/2020  9:31 AM 0.3 mL 10/10/2019 Intramuscular   Manufacturer: [place]   Lot: [id number]   [place]: [id number]

## 2020-01-27 ENCOUNTER — Ambulatory Visit: Payer: BC Managed Care – PPO | Attending: Internal Medicine

## 2020-01-27 DIAGNOSIS — Z23 Encounter for immunization: Secondary | ICD-10-CM

## 2020-01-27 NOTE — Progress Notes (Signed)
   Covid-19 Vaccination Clinic  Name:  [name]    MRN: [id number] DOB: Jun 13, 1976  01/27/2020  [name] was observed post Covid-19 immunization for 15 minutes without incident. She was provided with Vaccine Information Sheet and instruction to access the V-Safe system.   [name] was instructed to call 911 with any severe reactions post vaccine: Marland Kitchen Difficulty breathing  . Swelling of face and throat  . A fast heartbeat  . A bad rash all over body  . Dizziness and weakness   Immunizations Administered    Name Date Dose VIS Date Route   Pfizer COVID-19 Vaccine 01/27/2020  3:29 PM 0.3 mL 10/10/2019 Intramuscular   Manufacturer: [place]   Lot: [id number]   [place]: [id number]

## 2021-09-25 ENCOUNTER — Other Ambulatory Visit: Payer: Self-pay

## 2021-09-25 ENCOUNTER — Ambulatory Visit (HOSPITAL_COMMUNITY)
Admission: EM | Admit: 2021-09-25 | Discharge: 2021-09-25 | Disposition: A | Discharge status: Home / Self Care | Payer: BC Managed Care – PPO | Attending: Internal Medicine | Admitting: Internal Medicine

## 2021-09-25 ENCOUNTER — Encounter (HOSPITAL_COMMUNITY): Payer: Self-pay | Admitting: Emergency Medicine

## 2021-09-25 DIAGNOSIS — J018 Other acute sinusitis: Secondary | ICD-10-CM | POA: Diagnosis not present

## 2021-09-25 DIAGNOSIS — I10 Essential (primary) hypertension: Secondary | ICD-10-CM

## 2021-09-25 MED ORDER — CETIRIZINE HCL 10 MG PO TABS: 10.0000 mg | ORAL_TABLET | Freq: Every day | ORAL | 0 refills | Status: AC

## 2021-09-25 MED ORDER — IPRATROPIUM BROMIDE 0.03 % NA SOLN: 2.0000 | Freq: Two times a day (BID) | NASAL | 0 refills | Status: AC

## 2021-09-25 MED ORDER — AMOXICILLIN-POT CLAVULANATE 875-125 MG PO TABS: 1.0000 | ORAL_TABLET | Freq: Two times a day (BID) | ORAL | 0 refills | Status: AC

## 2021-09-25 NOTE — ED Provider Notes (Signed)
[place]   MRN: [id number] DOB: 08-22-1976  Subjective:   [name] is a 45 y.o. female presenting for 2-3 week history of persistent cough, sinus congestion, sinus headaches, fatigue. No history of asthma. Denies smoking history.   No current facility-administered medications for this encounter.  Current Outpatient Medications:    ALPRAZolam (XANAX) 0.5 MG tablet, Take by mouth., Disp: , Rfl:    levothyroxine (SYNTHROID) 75 MCG tablet, Take by mouth., Disp: , Rfl:    amLODipine (NORVASC) 5 MG tablet, Take 1 tablet (5 mg total) by mouth daily., Disp: 90 tablet, Rfl: 1   metoprolol succinate (TOPROL-XL) 50 MG 24 hr tablet, TAKE ONE TABLET BY MOUTH ONE TIME DAILY (TAKE WITH OR IMMEDIATELY FOLLOWING A MEAL), Disp: 90 tablet, Rfl: 1   mirabegron ER (MYRBETRIQ) 50 MG TB24 tablet, Take 1 tablet (50 mg total) by mouth daily., Disp: 30 tablet, Rfl: 11   sertraline (ZOLOFT) 100 MG tablet, Take 2 tablets (200 mg total) by mouth daily., Disp: 180 tablet, Rfl: 1   Allergies  Allergen Reactions   Elemental Sulfur Nausea Only   Hctz [Hydrochlorothiazide]     Feels bad on this medication/ has headache.    Past Medical History:  Diagnosis Date   Allergy    Anxiety    Depression    Family history of adverse reaction to anesthesia    MOM-HARD TO WAKE UP   Headache    MIGRAINES   History of kidney stones    H/O   History of methicillin resistant staphylococcus aureus (MRSA) 2009   Hypertension      Past Surgical History:  Procedure Laterality Date   CHOLECYSTECTOMY     PAROTIDECTOMY Right 04/15/2018   Procedure: PAROTIDECTOMY;  Surgeon: Carloyn Manner, MD;  Location: [facility] ORS;  Service: ENT;  Laterality: Right;    Family History  Problem Relation Age of Onset   Diabetes Mother    Heart disease Mother        CAD with stenting   Hypertension Mother    Hyperlipidemia Mother    Hypothyroidism Mother    Heart disease Father    Heart disease Maternal  Grandmother    Stroke Maternal Grandmother    Heart disease Paternal Grandmother    Heart disease Paternal Grandfather     Social History   Tobacco Use   Smoking status: Never   Smokeless tobacco: Never  Vaping Use   Vaping Use: Never used  Substance Use Topics   Alcohol use: No   Drug use: No    ROS   Objective:   Vitals: BP (!) 146/88 (BP Location: Left Arm)   Pulse 85   Temp 97.9 F (36.6 C) (Oral)   Resp 16   LMP 09/20/2021   SpO2 96%   Physical Exam Constitutional:      General: She is not in acute distress.    Appearance: Normal appearance. She is well-developed. She is not ill-appearing, toxic-appearing or diaphoretic.  HENT:     Head: Normocephalic and atraumatic.     Right Ear: Tympanic membrane and ear canal normal. No drainage or tenderness. No middle ear effusion. Tympanic membrane is not erythematous.     Left Ear: Tympanic membrane and ear canal normal. No drainage or tenderness.  No middle ear effusion. Tympanic membrane is not erythematous.     Nose: Nose normal. No congestion or rhinorrhea.     Mouth/Throat:     Mouth: Mucous membranes are moist. No oral  lesions.     Pharynx: Oropharynx is clear. No pharyngeal swelling, oropharyngeal exudate, posterior oropharyngeal erythema or uvula swelling.     Tonsils: No tonsillar exudate or tonsillar abscesses.  Eyes:     Extraocular Movements: Extraocular movements intact.     Right eye: Normal extraocular motion.     Left eye: Normal extraocular motion.     Conjunctiva/sclera: Conjunctivae normal.     Pupils: Pupils are equal, round, and reactive to light.  Cardiovascular:     Rate and Rhythm: Normal rate and regular rhythm.     Pulses: Normal pulses.     Heart sounds: Normal heart sounds. No murmur heard.   No friction rub. No gallop.  Pulmonary:     Effort: Pulmonary effort is normal. No respiratory distress.     Breath sounds: Normal breath sounds. No stridor. No wheezing, rhonchi or rales.   Musculoskeletal:     Cervical back: Normal range of motion and neck supple.  Lymphadenopathy:     Cervical: No cervical adenopathy.  Skin:    General: Skin is warm and dry.     Findings: No rash.  Neurological:     General: No focal deficit present.     Mental Status: She is alert and oriented to person, place, and time.  Psychiatric:        Mood and Affect: Mood normal.        Behavior: Behavior normal.        Thought Content: Thought content normal.    Assessment and Plan :   PDMP not reviewed this encounter.  1. Acute non-recurrent sinusitis of other sinus   2. Essential hypertension     Will start empiric treatment for sinusitis with Augmentin.  Recommended supportive care otherwise including the use of oral antihistamine, Atrovent. Follow up with PCP regarding bp. Counseled patient on potential for adverse effects with medications prescribed/recommended today, ER and return-to-clinic precautions discussed, patient verbalized understanding.    Jaynee Eagles, [place] 09/28/21 [phone]

## 2021-09-25 NOTE — ED Triage Notes (Signed)
Pt presents with cough, headache, fever, and fatigue xs 2-3 weeks.

## 2024-02-01 ENCOUNTER — Encounter: Payer: Self-pay | Admitting: Gastroenterology

## 2024-02-14 ENCOUNTER — Ambulatory Visit: Payer: PRIVATE HEALTH INSURANCE | Admitting: General Practice

## 2024-02-14 ENCOUNTER — Ambulatory Visit
Admission: RE | Admit: 2024-02-14 | Discharge: 2024-02-14 | Disposition: A | Discharge status: Home / Self Care | Payer: PRIVATE HEALTH INSURANCE | Source: Ambulatory Visit | Attending: Gastroenterology | Admitting: Gastroenterology

## 2024-02-14 ENCOUNTER — Encounter
Admission: RE | Disposition: A | Discharge status: Home / Self Care | Payer: Self-pay | Source: Ambulatory Visit | Attending: Gastroenterology

## 2024-02-14 ENCOUNTER — Encounter: Payer: Self-pay | Admitting: Gastroenterology

## 2024-02-14 DIAGNOSIS — Z1211 Encounter for screening for malignant neoplasm of colon: Secondary | ICD-10-CM | POA: Insufficient documentation

## 2024-02-14 DIAGNOSIS — E039 Hypothyroidism, unspecified: Secondary | ICD-10-CM | POA: Diagnosis not present

## 2024-02-14 DIAGNOSIS — Z79890 Hormone replacement therapy (postmenopausal): Secondary | ICD-10-CM | POA: Insufficient documentation

## 2024-02-14 DIAGNOSIS — Z79899 Other long term (current) drug therapy: Secondary | ICD-10-CM | POA: Insufficient documentation

## 2024-02-14 DIAGNOSIS — Z6841 Body Mass Index (BMI) 40.0 and over, adult: Secondary | ICD-10-CM | POA: Insufficient documentation

## 2024-02-14 DIAGNOSIS — K219 Gastro-esophageal reflux disease without esophagitis: Secondary | ICD-10-CM | POA: Insufficient documentation

## 2024-02-14 DIAGNOSIS — R519 Headache, unspecified: Secondary | ICD-10-CM | POA: Diagnosis not present

## 2024-02-14 DIAGNOSIS — K635 Polyp of colon: Secondary | ICD-10-CM | POA: Diagnosis not present

## 2024-02-14 DIAGNOSIS — Z83719 Family history of colon polyps, unspecified: Secondary | ICD-10-CM | POA: Diagnosis not present

## 2024-02-14 DIAGNOSIS — F419 Anxiety disorder, unspecified: Secondary | ICD-10-CM | POA: Insufficient documentation

## 2024-02-14 DIAGNOSIS — E66813 Obesity, class 3: Secondary | ICD-10-CM | POA: Insufficient documentation

## 2024-02-14 DIAGNOSIS — D124 Benign neoplasm of descending colon: Secondary | ICD-10-CM | POA: Insufficient documentation

## 2024-02-14 DIAGNOSIS — F32A Depression, unspecified: Secondary | ICD-10-CM | POA: Diagnosis not present

## 2024-02-14 DIAGNOSIS — D123 Benign neoplasm of transverse colon: Secondary | ICD-10-CM | POA: Insufficient documentation

## 2024-02-14 DIAGNOSIS — I10 Essential (primary) hypertension: Secondary | ICD-10-CM | POA: Insufficient documentation

## 2024-02-14 HISTORY — PX: COLONOSCOPY: SHX5424

## 2024-02-14 HISTORY — DX: Pure hypercholesterolemia, unspecified: E78.00

## 2024-02-14 HISTORY — DX: Prediabetes: R73.03

## 2024-02-14 HISTORY — DX: Hypothyroidism, unspecified: E03.9

## 2024-02-14 HISTORY — DX: Urge incontinence: N39.41

## 2024-02-14 HISTORY — PX: POLYPECTOMY: SHX149

## 2024-02-14 LAB — POCT PREGNANCY, URINE: Preg Test, Ur: NEGATIVE

## 2024-02-14 SURGERY — COLONOSCOPY
Anesthesia: General

## 2024-02-14 MED ORDER — LIDOCAINE HCL (CARDIAC) PF 100 MG/5ML IV SOSY
PREFILLED_SYRINGE | INTRAVENOUS | Status: DC | PRN
  Administered 2024-02-14: 100 mg via INTRAVENOUS

## 2024-02-14 MED ORDER — PHENYLEPHRINE 80 MCG/ML (10ML) SYRINGE FOR IV PUSH (FOR BLOOD PRESSURE SUPPORT)
PREFILLED_SYRINGE | INTRAVENOUS | Status: DC | PRN
  Administered 2024-02-14: 160 ug via INTRAVENOUS

## 2024-02-14 MED ORDER — PROPOFOL 500 MG/50ML IV EMUL
INTRAVENOUS | Status: DC | PRN
  Administered 2024-02-14: 75 ug/kg/min via INTRAVENOUS

## 2024-02-14 MED ORDER — PROPOFOL 1000 MG/100ML IV EMUL
INTRAVENOUS | Status: AC
  Filled 2024-02-14: qty 100

## 2024-02-14 MED ORDER — PROPOFOL 10 MG/ML IV BOLUS
INTRAVENOUS | Status: DC | PRN
  Administered 2024-02-14 (×2): 50 mg via INTRAVENOUS

## 2024-02-14 MED ORDER — LIDOCAINE HCL (PF) 2 % IJ SOLN
INTRAMUSCULAR | Status: AC
  Filled 2024-02-14: qty 5

## 2024-02-14 MED ORDER — DEXMEDETOMIDINE HCL IN NACL 80 MCG/20ML IV SOLN
INTRAVENOUS | Status: DC | PRN
  Administered 2024-02-14: 20 ug via INTRAVENOUS

## 2024-02-14 MED ORDER — SODIUM CHLORIDE 0.9 % IV SOLN: INTRAVENOUS | Status: DC

## 2024-02-14 NOTE — Op Note (Signed)
 [facility] Gastroenterology Patient Name: [name] Procedure Date: 02/14/2024 10:44 AM MRN: [id number] Account #: [id number] Date of Birth: 08/11/1976 Admit Type: Outpatient Age: 48 Room: [facility] ENDO ROOM 1 Gender: Female Note Status: Finalized Instrument Name: Prentice Docker [id number] Procedure:             Colonoscopy Indications:           Colon cancer screening in patient at increased risk:                         Family history of colon polyps in multiple 1st-degree                         relatives Providers:             Trenda Moots, DO Referring MD:          Myrle Sheng. Katrinka Blazing, MD (Referring MD) Medicines:             Monitored Anesthesia Care Complications:         No immediate complications. Estimated blood loss:                         Minimal. Procedure:             Pre-Anesthesia Assessment:                        - Prior to the procedure, a History and Physical was                         performed, and patient medications and allergies were                         reviewed. The patient is competent. The risks and                         benefits of the procedure and the sedation options and                         risks were discussed with the patient. All questions                         were answered and informed consent was obtained.                         Patient identification and proposed procedure were                         verified by the physician, the nurse, the anesthetist                         and the technician in the endoscopy suite. Mental                         Status Examination: alert and oriented. Airway                         Examination: normal oropharyngeal airway and neck  mobility. Respiratory Examination: clear to                         auscultation. CV Examination: RRR, no murmurs, no S3                         or S4. Prophylactic Antibiotics: The patient does not                          require prophylactic antibiotics. Prior                         Anticoagulants: The patient has taken no anticoagulant                         or antiplatelet agents. ASA Grade Assessment: III - A                         patient with severe systemic disease. After reviewing                         the risks and benefits, the patient was deemed in                         satisfactory condition to undergo the procedure. The                         anesthesia plan was to use monitored anesthesia care                         (MAC). Immediately prior to administration of                         medications, the patient was re-assessed for adequacy                         to receive sedatives. The heart rate, respiratory                         rate, oxygen saturations, blood pressure, adequacy of                         pulmonary ventilation, and response to care were                         monitored throughout the procedure. The physical                         status of the patient was re-assessed after the                         procedure.                        After obtaining informed consent, the colonoscope was                         passed under direct vision. Throughout the procedure,  the patient's blood pressure, pulse, and oxygen                         saturations were monitored continuously. The                         Colonoscope was introduced through the anus and                         advanced to the the terminal ileum, with                         identification of the appendiceal orifice and IC                         valve. The colonoscopy was performed without                         difficulty. The patient tolerated the procedure well.                         The quality of the bowel preparation was evaluated                         using the BBPS [facility] Bowel Preparation Scale) with                         scores of: Right Colon = 2 (minor  amount of residual                         staining, small fragments of stool and/or opaque                         liquid, but mucosa seen well), Transverse Colon = 3                         (entire mucosa seen well with no residual staining,                         small fragments of stool or opaque liquid) and Left                         Colon = 2 (minor amount of residual staining, small                         fragments of stool and/or opaque liquid, but mucosa                         seen well). The total BBPS score equals 7. The quality                         of the bowel preparation was good. The terminal ileum,                         ileocecal valve, appendiceal orifice, and rectum were  photographed. Findings:      The perianal and digital rectal examinations were normal. Pertinent       negatives include normal sphincter tone.      The terminal ileum appeared normal. Estimated blood loss: none.      Three sessile polyps were found in the descending colon, transverse       colon and ascending colon. The polyps were 1 to 2 mm in size. These       polyps were removed with a jumbo cold forceps. Resection and retrieval       were complete. Estimated blood loss was minimal.      Two sessile polyps were found in the transverse colon. The polyps were 4       to 7 mm in size. These polyps were removed with a cold snare. Resection       and retrieval were complete. Estimated blood loss was minimal.      The exam was otherwise without abnormality on direct and retroflexion       views. Impression:            - The examined portion of the ileum was normal.                        - Three 1 to 2 mm polyps in the descending colon, in                         the transverse colon and in the ascending colon,                         removed with a jumbo cold forceps. Resected and                         retrieved.                        - Two 4 to 7 mm polyps in the  transverse colon,                         removed with a cold snare. Resected and retrieved.                        - The examination was otherwise normal on direct and                         retroflexion views. Recommendation:        - Patient has a contact number available for                         emergencies. The signs and symptoms of potential                         delayed complications were discussed with the patient.                         Return to normal activities tomorrow. Written                         discharge instructions were provided to the patient.                        -  Discharge patient to home.                        - Resume previous diet.                        - Continue present medications.                        - No ibuprofen, naproxen, or other non-steroidal                         anti-inflammatory drugs for 5 days after polyp removal.                        - Await pathology results.                        - Repeat colonoscopy for surveillance based on                         pathology results.                        - Return to referring physician as previously                         scheduled.                        - The findings and recommendations were discussed with                         the patient.                        - The findings and recommendations were discussed with                         the patient's family. Procedure Code(s):     --- Professional ---                        [phone], Colonoscopy, flexible; with removal of                         tumor(s), polyp(s), or other lesion(s) by snare                         technique                        [id number], 59, Colonoscopy, flexible; with biopsy, single                         or multiple Diagnosis Code(s):     --- Professional ---                        Z83.71, Family history of colonic polyps                        D12.4, Benign neoplasm of descending colon  D12.2, Benign neoplasm of ascending colon                        D12.3, Benign neoplasm of transverse colon (hepatic                         flexure or splenic flexure) CPT copyright 2022 American Medical Association. All rights reserved. The codes documented in this report are preliminary and upon coder review may  be revised to meet current compliance requirements. Attending Participation:      I personally performed the entire procedure. Polo Brisk, DO Quintin Buckle DO, DO 02/14/2024 11:30:19 AM This report has been signed electronically. Number of Addenda: 0 Note Initiated On: 02/14/2024 10:44 AM Scope Withdrawal Time: 0 hours 16 minutes 22 seconds  Total Procedure Duration: 0 hours 21 minutes 59 seconds  Estimated Blood Loss:  Estimated blood loss was minimal.      [facility]

## 2024-02-14 NOTE — Anesthesia Preprocedure Evaluation (Signed)
 Anesthesia Evaluation  Patient identified by MRN, date of birth, ID band Patient awake    Reviewed: Allergy & Precautions, H&P , NPO status , reviewed documented beta blocker date and time   History of Anesthesia Complications (+) Family history of anesthesia reaction and history of anesthetic complications  Airway Mallampati: II  TM Distance: >3 FB Neck ROM: full    Dental  (+) Teeth Intact   Pulmonary    Pulmonary exam normal        Cardiovascular hypertension, Normal cardiovascular exam     Neuro/Psych  Headaches PSYCHIATRIC DISORDERS Anxiety Depression       GI/Hepatic ,GERD  Medicated and Controlled,,  Endo/Other  Hypothyroidism  Class 3 obesity  Renal/GU      Musculoskeletal   Abdominal   Peds  Hematology   Anesthesia Other Findings Past Medical History: No date: Allergy No date: Anxiety No date: Depression No date: Family history of adverse reaction to anesthesia     Comment:  MOM-HARD TO WAKE UP No date: Headache     Comment:  MIGRAINES No date: History of kidney stones     Comment:  H/O 2009: History of methicillin resistant staphylococcus aureus (MRSA) No date: Hypertension Past Surgical History: No date: CHOLECYSTECTOMY BMI    Body Mass Index:  45.69 kg/m     Reproductive/Obstetrics                             Anesthesia Physical Anesthesia Plan  ASA: 3  Anesthesia Plan: General   Post-op Pain Management: Minimal or no pain anticipated   Induction: Intravenous  PONV Risk Score and Plan: 3 and Propofol infusion, TIVA and Ondansetron  Airway Management Planned: Nasal Cannula  Additional Equipment: None  Intra-op Plan:   Post-operative Plan:   Informed Consent: I have reviewed the patients History and Physical, chart, labs and discussed the procedure including the risks, benefits and alternatives for the proposed anesthesia with the patient or authorized  representative who has indicated his/her understanding and acceptance.     Dental advisory given  Plan Discussed with: CRNA and Surgeon  Anesthesia Plan Comments: (Discussed risks of anesthesia with patient, including possibility of difficulty with spontaneous ventilation under anesthesia necessitating airway intervention, PONV, and rare risks such as cardiac or respiratory or neurological events, and allergic reactions. Discussed the role of CRNA in patient's perioperative care. Patient understands.)       Anesthesia Quick Evaluation

## 2024-02-14 NOTE — Transfer of Care (Signed)
 Immediate Anesthesia Transfer of Care Note  Patient: [name]  Procedure(s) Performed: COLONOSCOPY POLYPECTOMY, INTESTINE  Patient Location: PACU  Anesthesia Type:General  Level of Consciousness: sedated  Airway & Oxygen Therapy: Patient Spontanous Breathing  Post-op Assessment: Report given to RN and Post -op Vital signs reviewed and stable  Post vital signs: Reviewed and stable  Last Vitals:  Vitals Value Taken Time  BP 101/62 02/14/24 1129  Temp 36.1 C 02/14/24 1128  Pulse 74 02/14/24 1130  Resp 17 02/14/24 1130  SpO2 95 % 02/14/24 1130  Vitals shown include unfiled device data.  Last Pain:  Vitals:   02/14/24 1128  TempSrc: Temporal  PainSc: Asleep         Complications: No notable events documented.

## 2024-02-14 NOTE — Interval H&P Note (Signed)
 History and Physical Interval Note: Preprocedure H&P from 02/14/24  was reviewed and there was no interval change after seeing and examining the patient.  Written consent was obtained from the patient after discussion of risks, benefits, and alternatives. Patient has consented to proceed with Colonoscopy with possible intervention   02/14/2024 10:52 AM  [name]  has presented today for surgery, with the diagnosis of Encounter for screening colonoscopy for non-high-risk patient (Z12.11).  The various methods of treatment have been discussed with the patient and family. After consideration of risks, benefits and other options for treatment, the patient has consented to  Procedure(s): COLONOSCOPY (N/A) as a surgical intervention.  The patient's history has been reviewed, patient examined, no change in status, stable for surgery.  I have reviewed the patient's chart and labs.  Questions were answered to the patient's satisfaction.     [name]

## 2024-02-14 NOTE — H&P (Signed)
 Pre-Procedure H&P   Patient ID: [name] is a 48 y.o. female.  Gastroenterology Provider: Jaynie Collins, DO  Referring Provider: Dr. Katrinka Sandoval PCP: Debra Chick, MD  Date: 02/14/2024  HPI [name] is a 48 y.o. female who presents today for Colonoscopy for Colorectal cancer screening; family history of colon polyps .  Mother and sister with colon polyps. Patient undergoing initial screening colonoscopy.  Reports daily bowel movement without melena or hematochezia  Hemoglobin 13.4 MCV 86 platelets 296,000 creatinine 0.7   Past Medical History:  Diagnosis Date   Allergy    Anxiety    Depression    Family history of adverse reaction to anesthesia    MOM-HARD TO WAKE UP   Headache    MIGRAINES   History of kidney stones    H/O   History of methicillin resistant staphylococcus aureus (MRSA) 2009   Hypertension    Hypothyroidism    Pre-diabetes    Pure hypercholesterolemia    Urge incontinence     Past Surgical History:  Procedure Laterality Date   CHOLECYSTECTOMY     Hx of superficial parotidectomy N/A    PAROTIDECTOMY Right 04/15/2018   Procedure: PAROTIDECTOMY;  Surgeon: Bud Face, MD;  Location: [facility] ORS;  Service: ENT;  Laterality: Right;    Family History Mother and sister with colon polyps No other h/o GI disease or malignancy  Review of Systems  Constitutional:  Negative for activity change, appetite change, chills, diaphoresis, fatigue, fever and unexpected weight change.  HENT:  Negative for trouble swallowing and voice change.   Respiratory:  Negative for shortness of breath and wheezing.   Cardiovascular:  Negative for chest pain, palpitations and leg swelling.  Gastrointestinal:  Negative for abdominal distention, abdominal pain, anal bleeding, blood in stool, constipation, diarrhea, nausea, rectal pain and vomiting.  Musculoskeletal:  Negative for arthralgias and myalgias.  Skin:  Negative for color change and pallor.   Neurological:  Negative for dizziness, syncope and weakness.  Psychiatric/Behavioral:  Negative for confusion.   All other systems reviewed and are negative.    Medications No current facility-administered medications on file prior to encounter.   Current Outpatient Medications on File Prior to Encounter  Medication Sig Dispense Refill   ALPRAZolam (XANAX) 0.5 MG tablet Take by mouth.     ibuprofen (ADVIL) 200 MG tablet Take 200 mg by mouth every 6 (six) hours as needed.     levothyroxine (SYNTHROID) 75 MCG tablet Take by mouth.     metoprolol succinate (TOPROL-XL) 50 MG 24 hr tablet TAKE ONE TABLET BY MOUTH ONE TIME DAILY (TAKE WITH OR IMMEDIATELY FOLLOWING A MEAL) 90 tablet 1   sertraline (ZOLOFT) 100 MG tablet Take 2 tablets (200 mg total) by mouth daily. 180 tablet 1   amLODipine (NORVASC) 5 MG tablet Take 1 tablet (5 mg total) by mouth daily. (Patient not taking: Reported on 02/14/2024) 90 tablet 1   amoxicillin-clavulanate (AUGMENTIN) 875-125 MG tablet Take 1 tablet by mouth every 12 (twelve) hours. (Patient not taking: Reported on 02/14/2024) 14 tablet 0   cetirizine (ZYRTEC ALLERGY) 10 MG tablet Take 1 tablet (10 mg total) by mouth daily. 30 tablet 0   ipratropium (ATROVENT) 0.03 % nasal spray Place 2 sprays into both nostrils 2 (two) times daily. 30 mL 0   mirabegron ER (MYRBETRIQ) 50 MG TB24 tablet Take 1 tablet (50 mg total) by mouth daily. 30 tablet 11    Pertinent medications related to GI and procedure were  reviewed by me with the patient prior to the procedure   Current Facility-Administered Medications:    0.9 %  sodium chloride infusion, , Intravenous, Continuous, Quintin Buckle, DO  sodium chloride         Allergies  Allergen Reactions   Elemental Sulfur Nausea Only   Hctz [Hydrochlorothiazide]     Feels bad on this medication/ has headache.   Allergies were reviewed by me prior to the procedure  Objective   Body mass index is 43.7 kg/m. Vitals:    02/14/24 0948  BP: (!) 159/92  Pulse: 87  Resp: 18  Temp: (!) 97 F (36.1 C)  TempSrc: Temporal  SpO2: 98%  Weight: 130.4 kg  Height: 5\' 8"  (1.727 m)     Physical Exam Vitals and nursing note reviewed.  Constitutional:      General: She is not in acute distress.    Appearance: Normal appearance. She is obese. She is not ill-appearing, toxic-appearing or diaphoretic.  HENT:     Head: Normocephalic and atraumatic.     Nose: Nose normal.     Mouth/Throat:     Mouth: Mucous membranes are moist.     Pharynx: Oropharynx is clear.  Eyes:     General: No scleral icterus.    Extraocular Movements: Extraocular movements intact.  Cardiovascular:     Rate and Rhythm: Normal rate and regular rhythm.     Heart sounds: Normal heart sounds. No murmur heard.    No friction rub. No gallop.  Pulmonary:     Effort: Pulmonary effort is normal. No respiratory distress.     Breath sounds: Normal breath sounds. No wheezing, rhonchi or rales.  Abdominal:     General: Bowel sounds are normal. There is no distension.     Palpations: Abdomen is soft.     Tenderness: There is no abdominal tenderness. There is no guarding or rebound.  Musculoskeletal:     Cervical back: Neck supple.     Right lower leg: No edema.     Left lower leg: No edema.  Skin:    General: Skin is warm and dry.     Coloration: Skin is not jaundiced or pale.  Neurological:     General: No focal deficit present.     Mental Status: She is alert and oriented to person, place, and time. Mental status is at baseline.  Psychiatric:        Mood and Affect: Mood normal.        Behavior: Behavior normal.        Thought Content: Thought content normal.        Judgment: Judgment normal.      Assessment:  [name] is a 48 y.o. female  who presents today for Colonoscopy for Colorectal cancer screening; family history of colon polyps .  Plan:  Colonoscopy with possible intervention today  Colonoscopy with possible  biopsy, control of bleeding, polypectomy, and interventions as necessary has been discussed with the patient/patient representative. Informed consent was obtained from the patient/patient representative after explaining the indication, nature, and risks of the procedure including but not limited to death, bleeding, perforation, missed neoplasm/lesions, cardiorespiratory compromise, and reaction to medications. Opportunity for questions was given and appropriate answers were provided. Patient/patient representative has verbalized understanding is amenable to undergoing the procedure.   Quintin Buckle, DO  [facility]astroenterology  Portions of the record may have been created with voice recognition software. Occasional wrong-word or 'sound-a-like' substitutions may have  occurred due to the inherent limitations of voice recognition software.  Read the chart carefully and recognize, using context, where substitutions may have occurred.

## 2024-02-15 LAB — SURGICAL PATHOLOGY

## 2024-02-15 NOTE — Anesthesia Postprocedure Evaluation (Signed)
 Anesthesia Post Note  Patient: [name]  Procedure(s) Performed: COLONOSCOPY POLYPECTOMY, INTESTINE  Patient location during evaluation: Endoscopy Anesthesia Type: General Level of consciousness: awake and alert Pain management: pain level controlled Vital Signs Assessment: post-procedure vital signs reviewed and stable Respiratory status: spontaneous breathing, nonlabored ventilation, respiratory function stable and patient connected to nasal cannula oxygen Cardiovascular status: blood pressure returned to baseline and stable Postop Assessment: no apparent nausea or vomiting Anesthetic complications: no  No notable events documented.   Last Vitals:  Vitals:   02/14/24 1138 02/14/24 1150  BP:  115/71  Pulse:  77  Resp: 17 14  Temp:    SpO2:  97%    Last Pain:  Vitals:   02/14/24 1150  TempSrc:   PainSc: 0-No pain                 [name]
